# Patient Record
Sex: Female | Born: 1980
Health system: Southern US, Community
[De-identification: ages and names within clinical notes are randomized; demographics above are authoritative.]

## PROBLEM LIST (undated history)

## (undated) DIAGNOSIS — G43909 Migraine, unspecified, not intractable, without status migrainosus: Secondary | ICD-10-CM

## (undated) HISTORY — DX: Migraine, unspecified, not intractable, without status migrainosus: G43.909

---
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
## 1999-12-23 ENCOUNTER — Emergency Department (HOSPITAL_COMMUNITY): Admission: EM | Admit: 1999-12-23 | Discharge: 1999-12-23 | Payer: Self-pay | Admitting: *Deleted

## 1999-12-23 ENCOUNTER — Encounter: Payer: Self-pay | Admitting: *Deleted

## 2001-07-19 ENCOUNTER — Emergency Department (HOSPITAL_COMMUNITY): Admission: EM | Admit: 2001-07-19 | Discharge: 2001-07-19 | Payer: Self-pay | Admitting: Emergency Medicine

## 2003-09-02 ENCOUNTER — Emergency Department (HOSPITAL_COMMUNITY): Admission: EM | Admit: 2003-09-02 | Discharge: 2003-09-03 | Payer: Self-pay | Admitting: Emergency Medicine

## 2003-09-05 ENCOUNTER — Emergency Department (HOSPITAL_COMMUNITY): Admission: EM | Admit: 2003-09-05 | Discharge: 2003-09-05 | Payer: Self-pay

## 2005-01-17 ENCOUNTER — Emergency Department (HOSPITAL_COMMUNITY): Admission: EM | Admit: 2005-01-17 | Discharge: 2005-01-18 | Payer: Self-pay | Admitting: Emergency Medicine

## 2005-02-12 ENCOUNTER — Emergency Department (HOSPITAL_COMMUNITY): Admission: EM | Admit: 2005-02-12 | Discharge: 2005-02-12 | Payer: Self-pay | Admitting: Emergency Medicine

## 2005-05-10 ENCOUNTER — Emergency Department (HOSPITAL_COMMUNITY): Admission: EM | Admit: 2005-05-10 | Discharge: 2005-05-10 | Payer: Self-pay | Admitting: Emergency Medicine

## 2006-01-27 ENCOUNTER — Emergency Department (HOSPITAL_COMMUNITY): Admission: EM | Admit: 2006-01-27 | Discharge: 2006-01-27 | Payer: Self-pay | Admitting: Emergency Medicine

## 2008-06-01 ENCOUNTER — Emergency Department (HOSPITAL_COMMUNITY): Admission: EM | Admit: 2008-06-01 | Discharge: 2008-06-02 | Payer: Self-pay | Admitting: Emergency Medicine

## 2008-06-04 ENCOUNTER — Inpatient Hospital Stay (HOSPITAL_COMMUNITY): Admission: AD | Admit: 2008-06-04 | Discharge: 2008-06-04 | Payer: Self-pay | Admitting: Obstetrics & Gynecology

## 2008-06-26 ENCOUNTER — Encounter: Payer: Self-pay | Admitting: Obstetrics and Gynecology

## 2008-06-26 ENCOUNTER — Ambulatory Visit: Payer: Self-pay | Admitting: Obstetrics and Gynecology

## 2009-02-22 ENCOUNTER — Inpatient Hospital Stay (HOSPITAL_COMMUNITY): Admission: AD | Admit: 2009-02-22 | Discharge: 2009-02-22 | Payer: Self-pay | Admitting: Obstetrics & Gynecology

## 2009-04-14 ENCOUNTER — Ambulatory Visit (HOSPITAL_COMMUNITY): Admission: RE | Admit: 2009-04-14 | Discharge: 2009-04-14 | Payer: Self-pay | Admitting: Obstetrics & Gynecology

## 2009-09-01 ENCOUNTER — Inpatient Hospital Stay (HOSPITAL_COMMUNITY): Admission: AD | Admit: 2009-09-01 | Discharge: 2009-09-01 | Payer: Self-pay | Admitting: Obstetrics

## 2009-09-01 ENCOUNTER — Ambulatory Visit: Payer: Self-pay | Admitting: Obstetrics and Gynecology

## 2009-09-06 ENCOUNTER — Inpatient Hospital Stay (HOSPITAL_COMMUNITY): Admission: AD | Admit: 2009-09-06 | Discharge: 2009-09-06 | Payer: Self-pay | Admitting: Obstetrics

## 2009-09-06 ENCOUNTER — Inpatient Hospital Stay (HOSPITAL_COMMUNITY): Admission: AD | Admit: 2009-09-06 | Discharge: 2009-09-10 | Payer: Self-pay | Admitting: Obstetrics

## 2009-09-07 ENCOUNTER — Encounter: Payer: Self-pay | Admitting: Obstetrics

## 2010-03-14 ENCOUNTER — Encounter: Payer: Self-pay | Admitting: Obstetrics & Gynecology

## 2010-05-08 LAB — CBC
HCT: 25.9 % — ABNORMAL LOW (ref 36.0–46.0)
HCT: 37.4 % (ref 36.0–46.0)
Hemoglobin: 12.6 g/dL (ref 12.0–15.0)
Hemoglobin: 8.8 g/dL — ABNORMAL LOW (ref 12.0–15.0)
MCHC: 33.7 g/dL (ref 30.0–36.0)
MCV: 88.6 fL (ref 78.0–100.0)
RBC: 4.22 MIL/uL (ref 3.87–5.11)
RDW: 14.3 % (ref 11.5–15.5)
WBC: 11.4 10*3/uL — ABNORMAL HIGH (ref 4.0–10.5)
WBC: 7.9 10*3/uL (ref 4.0–10.5)

## 2010-05-08 LAB — CCBB MATERNAL DONOR DRAW

## 2010-05-09 LAB — URINALYSIS, ROUTINE W REFLEX MICROSCOPIC
Glucose, UA: NEGATIVE mg/dL
Specific Gravity, Urine: 1.025 (ref 1.005–1.030)
pH: 6 (ref 5.0–8.0)

## 2010-05-09 LAB — URINE MICROSCOPIC-ADD ON

## 2010-06-02 LAB — URINE CULTURE

## 2010-06-02 LAB — POCT I-STAT, CHEM 8
HCT: 38 % (ref 36.0–46.0)
Hemoglobin: 12.9 g/dL (ref 12.0–15.0)
Potassium: 3.7 mEq/L (ref 3.5–5.1)
Sodium: 138 mEq/L (ref 135–145)
TCO2: 21 mmol/L (ref 0–100)

## 2010-06-02 LAB — DIFFERENTIAL
Eosinophils Relative: 4 % (ref 0–5)
Lymphocytes Relative: 40 % (ref 12–46)
Lymphs Abs: 2.9 10*3/uL (ref 0.7–4.0)
Monocytes Relative: 8 % (ref 3–12)
Neutrophils Relative %: 47 % (ref 43–77)

## 2010-06-02 LAB — CBC
MCV: 92.2 fL (ref 78.0–100.0)
Platelets: 238 10*3/uL (ref 150–400)
RBC: 3.9 MIL/uL (ref 3.87–5.11)
RBC: 3.98 MIL/uL (ref 3.87–5.11)
WBC: 7.2 10*3/uL (ref 4.0–10.5)
WBC: 5.3 10*3/uL (ref 4.0–10.5)

## 2010-06-02 LAB — URINALYSIS, ROUTINE W REFLEX MICROSCOPIC
Glucose, UA: NEGATIVE mg/dL
Leukocytes, UA: NEGATIVE
Nitrite: NEGATIVE
Protein, ur: 30 mg/dL — AB

## 2010-06-02 LAB — ABO/RH: ABO/RH(D): O POS

## 2010-06-02 LAB — URINE MICROSCOPIC-ADD ON

## 2010-06-02 LAB — HCG, QUANTITATIVE, PREGNANCY
hCG, Beta Chain, Quant, S: 1486 m[IU]/mL — ABNORMAL HIGH (ref <5)
hCG, Beta Chain, Quant, S: 2173 m[IU]/mL — ABNORMAL HIGH (ref <5)

## 2010-06-02 LAB — WET PREP, GENITAL: Trich, Wet Prep: NONE SEEN

## 2010-07-06 NOTE — Group Therapy Note (Signed)
NAMEJOSEFITA, [name]                ACCOUNT NO.:  [id number]   MEDICAL RECORD NO.:  [id number]          PATIENT TYPE:  WOC   LOCATION:  [facility]                   FACILITY:  [facility]   PHYSICIAN:  Argentina Donovan, MD        DATE OF BIRTH:  Sep 13, 1980   DATE OF SERVICE:                                  CLINIC NOTE   HISTORY OF PRESENT ILLNESS:  The patient is a new patient here for  followup on a spontaneous abortion on June 01, 2008.  The patient was  referred by the emergency department for followup.  She did receive  Cytotec from the emergency department.  The patient originally presented  to the ED with complaints of vaginal bleeding and passage of clots that  evening.  She also had some associated abdominal cramping.  Her last  menstrual period was noted to be on April 27, 2008.  The patient did  receive an ultrasound which showed a small amount of endometrial fluid  likely related to tiny intrauterine gestational sac.  The sac diameter  of 3 mm corresponded to 5 weeks, 0 days.  There was no yolk sac, fetal  pole, or cardiac activity identified.  Right ovarian 1.2 cm complex  cystic lesion was also seen which was thought to be most likely corpus  luteal cyst.  Beta hCG at that time was 1486 milli-international  units/mL.  Hemoglobin at that time was 11.8.  Her BMP was unremarkable.  Urine pregnancy was positive in the emergency department.  She was, at  that time, discharged home with the discussion that this was likely  possible inevitable versus threatened abortion.  She was advised to  follow up in 48 hours for repeat clot which she did on June 04, 2008.  At that time, she had continued to have bleeding with 1-cm clots and  more cramping.  Quantitative hCG at that time was 2173 which was not  doubling.  Repeat ultrasound showed intrauterine gestational sac but no  yolk sac.  Options of management were discussed with the patient at  length and she opted to use Cytotec, and this was  placed while she was  there in the emergency department.  Since that visit, she has not had  any more abdominal pain or cramping, has had some moderate bleeding  which has resolved by this office visit.  She denies any fevers,  cramping, or abdominal pain, and states that she is otherwise feeling  well.  She does report that this was not a planned pregnancy, however,  would like to continue to try to become pregnant this year.   PAST MEDICAL HISTORY:  Significant for no medical problems.   ALLERGIES:  No known drug allergies.   MEDICATIONS:  Prenatal vitamins.   MENSTRUAL HISTORY:  Age of menses is age 30, typically lasts 7 days,  heavy and medium flow with moderate pain.  No bleeding between periods.   OBSTETRICAL HISTORY:  Includes 2 total pregnancies, one termination and  now one miscarriage.   CONTRACEPTION HISTORY:  None.   Date of last Pap was May 2009.  She had  1 abnormal Pap 5 years ago,  repeat was normal.   SURGICAL HISTORY:  None.   FAMILY HISTORY:  Significant for cancer in her mother who is now  deceased.   SOCIAL HISTORY:  She denies any tobacco, alcohol, or drug use.   REVIEW OF SYSTEMS:  Negative except as noted in the HPI.   PHYSICAL EXAMINATION:  VITAL SIGNS:  Temperature is 97.9, pulse 88,  blood pressure is 123/87, weight is 151.9 pounds.  She is 5 feet 7  inches, respiratory rate 16.  GENERAL:  A young African American female in no acute distress.  HEENT:  Mucous membranes are moist.  NECK:  Supple.  No thyromegaly.  LUNGS:  Clear to auscultation bilaterally.  CARDIOVASCULAR:  Regular rate and rhythm.  No murmurs.  ABDOMEN:  Soft, nontender, nondistended.  Bowel sounds are present.  GU:  Speculum exam revealed no acute white discharge in the posterior  fornix.  Cervix appeared normal with no lesions or ulcerations.  Os was  closed.  Bimanual exam was nontender with no adnexal masses.  EXTREMITIES:  Peripheral pulses 2+.  No clubbing or cyanosis.   PSYCH:  Mood was appropriate.  Affect appropriate.   ASSESSMENT:  Spontaneous abortion status post Cytotec.   PLAN:  The patient was counseled to continue prenatal vitamins if she  desired to conceive again.  She is also advised to wait at least 2-3  cycles before conceiving.  She was also given signs and symptoms for  when to call back such as fevers, abdominal cramping, or more vaginal  bleeding.  We will check a serum beta hCG today.  Pap smear was also  performed today as her last Pap was done in May 2009.  We will follow up  on the results and call the patient when the results are back.     ______________________________  Argentina Donovan, MD    ______________________________  Argentina Donovan, MD    PR/MEDQ  D:  06/26/2008  T:  06/27/2008  Job:  [id number]

## 2013-07-09 ENCOUNTER — Ambulatory Visit: Payer: Self-pay | Admitting: Advanced Practice Midwife

## 2013-07-23 ENCOUNTER — Encounter: Payer: Self-pay | Admitting: Advanced Practice Midwife

## 2013-07-23 ENCOUNTER — Ambulatory Visit (INDEPENDENT_AMBULATORY_CARE_PROVIDER_SITE_OTHER): Payer: BC Managed Care – PPO | Admitting: Advanced Practice Midwife

## 2013-07-23 VITALS — BP 128/75 | HR 73 | Temp 98.0°F | Ht 67.0 in | Wt 169.0 lb

## 2013-07-23 DIAGNOSIS — Z01419 Encounter for gynecological examination (general) (routine) without abnormal findings: Secondary | ICD-10-CM

## 2013-07-23 DIAGNOSIS — Z309 Encounter for contraceptive management, unspecified: Secondary | ICD-10-CM

## 2013-07-23 MED ORDER — NORGESTIM-ETH ESTRAD TRIPHASIC 0.18/0.215/0.25 MG-35 MCG PO TABS: 1.0000 | ORAL_TABLET | Freq: Every day | ORAL | Status: DC

## 2013-07-23 NOTE — Progress Notes (Signed)
.   Subjective:     [name] is a 33 y.o. female and is here for a comprehensive physical exam. The patient reports no problems.  History   Social History  . Marital Status: Single    Spouse Name: N/A    Number of Children: N/A  . Years of Education: N/A   Occupational History  . Not on file.   Social History Main Topics  . Smoking status: Former Games developer  . Smokeless tobacco: Never Used  . Alcohol Use: Yes  . Drug Use: No  . Sexual Activity: Yes    Partners: Male    Birth Control/ Protection: Pill   Other Topics Concern  . Not on file   Social History Narrative  . No narrative on file   Health Maintenance  Topic Date Due  . Pap Smear  02/25/1996  . Tetanus/tdap  02/24/1997  . Influenza Vaccine  09/21/2013    The following portions of the patient's history were reviewed and updated as appropriate: allergies, current medications, past family history, past medical history, past social history, past surgical history and problem list.  Review of Systems Constitutional: negative for fatigue and weight loss Respiratory: negative for cough and wheezing Cardiovascular: negative for chest pain, fatigue and palpitations Gastrointestinal: negative for abdominal pain and change in bowel habits Genitourinary:negative Integument/breast: negative for nipple discharge Musculoskeletal:negative for myalgias Neurological: negative for gait problems and tremors Behavioral/Psych: negative for abusive relationship, depression Endocrine: negative for temperature intolerance         Objective:    BP 128/75  Pulse 73  Temp(Src) 98 F (36.7 C)  Ht 5\' 7"  (1.702 m)  Wt 169 lb (76.658 kg)  BMI 26.46 kg/m2  LMP 06/30/2013 General appearance: alert and cooperative Head: Normocephalic, without obvious abnormality, atraumatic Eyes: conjunctivae/corneas clear. PERRL, EOM's intact. Fundi benign. Neck: no adenopathy, no carotid bruit, no JVD, supple, symmetrical, trachea midline  and thyroid not enlarged, symmetric, no tenderness/mass/nodules Back: symmetric, no curvature. ROM normal. No CVA tenderness. Lungs: clear to auscultation bilaterally Breasts: normal appearance, no masses or tenderness Heart: regular rate and rhythm, S1, S2 normal, no murmur, click, rub or gallop Abdomen: soft, non-tender; bowel sounds normal; no masses,  no organomegaly Pelvic: cervix normal in appearance, external genitalia normal, no adnexal masses or tenderness, no cervical motion tenderness, rectovaginal septum normal, uterus normal size, shape, and consistency and vagina normal without discharge Extremities: extremities normal, atraumatic, no cyanosis or edema Pulses: 2+ and symmetric Skin: Skin color, texture, turgor normal. No rashes or lesions Lymph nodes: Cervical, supraclavicular, and axillary nodes normal. Neurologic: Grossly normal    Assessment:    Healthy female exam.  BCM: Ortho Tri Cyclen      Plan:      Orders Placed This Encounter  Procedures  . GC/Chlamydia Probe Amp  . HIV antibody  . Hepatitis B surface antigen  . RPR  . Hepatitis C antibody  . TSH  . CBC  . Comprehensive metabolic panel   Meds ordered this encounter  Medications  . Norgestimate-Ethinyl Estradiol Triphasic 0.18/0.215/0.25 MG-35 MCG tablet    Sig: Take 1 tablet by mouth daily.    Dispense:  1 Package    Refill:  11    Order Specific Question:  Supervising Provider    Answer:  Coral Ceo A [[id number]]    See After Visit Summary for Counseling Recommendations

## 2013-07-24 LAB — COMPREHENSIVE METABOLIC PANEL
ALT: 10 U/L (ref 0–35)
AST: 14 U/L (ref 0–37)
Albumin: 4 g/dL (ref 3.5–5.2)
Alkaline Phosphatase: 38 U/L — ABNORMAL LOW (ref 39–117)
BUN: 11 mg/dL (ref 6–23)
CALCIUM: 9.2 mg/dL (ref 8.4–10.5)
CHLORIDE: 101 meq/L (ref 96–112)
CO2: 28 meq/L (ref 19–32)
CREATININE: 0.69 mg/dL (ref 0.50–1.10)
Glucose, Bld: 57 mg/dL — ABNORMAL LOW (ref 70–99)
Potassium: 3.7 mEq/L (ref 3.5–5.3)
Sodium: 135 mEq/L (ref 135–145)
Total Bilirubin: 0.4 mg/dL (ref 0.2–1.2)
Total Protein: 6.8 g/dL (ref 6.0–8.3)

## 2013-07-24 LAB — CBC
HEMATOCRIT: 35.9 % — AB (ref 36.0–46.0)
Hemoglobin: 11.6 g/dL — ABNORMAL LOW (ref 12.0–15.0)
MCH: 29.3 pg (ref 26.0–34.0)
MCHC: 32.3 g/dL (ref 30.0–36.0)
MCV: 90.7 fL (ref 78.0–100.0)
Platelets: 264 10*3/uL (ref 150–400)
RBC: 3.96 MIL/uL (ref 3.87–5.11)
RDW: 12.5 % (ref 11.5–15.5)
WBC: 5.7 10*3/uL (ref 4.0–10.5)

## 2013-07-24 LAB — HEPATITIS B SURFACE ANTIGEN: HEP B S AG: NEGATIVE

## 2013-07-24 LAB — HIV ANTIBODY (ROUTINE TESTING W REFLEX): HIV 1&2 Ab, 4th Generation: NONREACTIVE

## 2013-07-24 LAB — HEPATITIS C ANTIBODY: HCV Ab: NEGATIVE

## 2013-07-24 LAB — GC/CHLAMYDIA PROBE AMP
CT Probe RNA: NEGATIVE
GC Probe RNA: NEGATIVE

## 2013-07-24 LAB — TSH: TSH: 0.426 u[IU]/mL (ref 0.350–4.500)

## 2013-07-24 LAB — RPR

## 2013-07-25 LAB — PAP IG AND HPV HIGH-RISK: HPV DNA HIGH RISK: NOT DETECTED

## 2013-08-28 ENCOUNTER — Other Ambulatory Visit: Payer: Self-pay | Admitting: Obstetrics & Gynecology

## 2013-11-18 ENCOUNTER — Telehealth: Payer: Self-pay | Admitting: *Deleted

## 2013-11-18 NOTE — Telephone Encounter (Signed)
Fax from pharmacy requesting refill on Tri Previfem tablet   Please advise

## 2013-11-21 NOTE — Telephone Encounter (Signed)
[facility] faxed over another refill request for Tri-Previfem Tablets, QTY: 28, Refills: 2   Please Advise.

## 2013-11-24 NOTE — Telephone Encounter (Signed)
OK to refill

## 2013-11-25 ENCOUNTER — Other Ambulatory Visit: Payer: Self-pay | Admitting: *Deleted

## 2013-11-25 DIAGNOSIS — Z3041 Encounter for surveillance of contraceptive pills: Secondary | ICD-10-CM

## 2013-11-25 MED ORDER — NORGESTIM-ETH ESTRAD TRIPHASIC 0.18/0.215/0.25 MG-35 MCG PO TABS: 1.0000 | ORAL_TABLET | Freq: Every day | ORAL | Status: DC

## 2013-11-25 NOTE — Telephone Encounter (Signed)
Patient called regarding refill. Refill okayed by Dr. Tamela OddiJackson-Moore. Patient is up to date so Rx sent with 11 refills. I apologized to the patient for the incontinence and notified her to double up on her pills today since she missed yesterday. Patient notified that she should not have this issue any longer because she was given 11 refills. Patient voiced understanding.

## 2013-11-26 ENCOUNTER — Telehealth: Payer: Self-pay | Admitting: *Deleted

## 2013-11-26 NOTE — Telephone Encounter (Signed)
Refill was approved by Dr. Tamela OddiJackson-Moore and was taken care of yesterday.

## 2013-11-26 NOTE — Telephone Encounter (Signed)
Fax from pharmacy for refill on Tri Previfem tablet, 28 day.   Please advise on refill.

## 2013-12-23 ENCOUNTER — Encounter: Payer: Self-pay | Admitting: Advanced Practice Midwife

## 2014-02-17 ENCOUNTER — Encounter: Payer: Self-pay | Admitting: *Deleted

## 2014-02-18 ENCOUNTER — Encounter: Payer: Self-pay | Admitting: Obstetrics & Gynecology

## 2014-05-06 ENCOUNTER — Telehealth: Payer: Self-pay | Admitting: *Deleted

## 2014-05-06 NOTE — Telephone Encounter (Signed)
Patient is requesting a RF on her Ibuprofen 800 mg at [facility].

## 2014-05-07 ENCOUNTER — Other Ambulatory Visit: Payer: Self-pay | Admitting: Obstetrics

## 2014-05-07 DIAGNOSIS — N946 Dysmenorrhea, unspecified: Secondary | ICD-10-CM

## 2014-05-07 MED ORDER — IBUPROFEN 800 MG PO TABS: 800.0000 mg | ORAL_TABLET | Freq: Three times a day (TID) | ORAL | Status: DC | PRN

## 2014-05-07 NOTE — Telephone Encounter (Signed)
Ibuprofen Rx. 

## 2014-05-09 NOTE — Telephone Encounter (Signed)
Patient notified per VM 

## 2014-07-25 ENCOUNTER — Ambulatory Visit: Payer: Self-pay | Admitting: Certified Nurse Midwife

## 2014-08-12 ENCOUNTER — Ambulatory Visit (INDEPENDENT_AMBULATORY_CARE_PROVIDER_SITE_OTHER): Payer: BLUE CROSS/BLUE SHIELD | Admitting: Certified Nurse Midwife

## 2014-08-12 ENCOUNTER — Encounter: Payer: Self-pay | Admitting: Certified Nurse Midwife

## 2014-08-12 VITALS — BP 127/78 | HR 72 | Temp 98.6°F | Ht 67.0 in | Wt 166.8 lb

## 2014-08-12 DIAGNOSIS — Z9109 Other allergy status, other than to drugs and biological substances: Secondary | ICD-10-CM | POA: Insufficient documentation

## 2014-08-12 DIAGNOSIS — Z91048 Other nonmedicinal substance allergy status: Secondary | ICD-10-CM

## 2014-08-12 DIAGNOSIS — R51 Headache: Secondary | ICD-10-CM | POA: Diagnosis not present

## 2014-08-12 DIAGNOSIS — R519 Headache, unspecified: Secondary | ICD-10-CM | POA: Insufficient documentation

## 2014-08-12 DIAGNOSIS — Z3009 Encounter for other general counseling and advice on contraception: Secondary | ICD-10-CM | POA: Diagnosis not present

## 2014-08-12 DIAGNOSIS — N939 Abnormal uterine and vaginal bleeding, unspecified: Secondary | ICD-10-CM | POA: Diagnosis not present

## 2014-08-12 DIAGNOSIS — Z124 Encounter for screening for malignant neoplasm of cervix: Secondary | ICD-10-CM

## 2014-08-12 DIAGNOSIS — Z01419 Encounter for gynecological examination (general) (routine) without abnormal findings: Secondary | ICD-10-CM

## 2014-08-12 DIAGNOSIS — G8929 Other chronic pain: Secondary | ICD-10-CM

## 2014-08-12 DIAGNOSIS — Z113 Encounter for screening for infections with a predominantly sexual mode of transmission: Secondary | ICD-10-CM

## 2014-08-12 DIAGNOSIS — G43919 Migraine, unspecified, intractable, without status migrainosus: Secondary | ICD-10-CM

## 2014-08-12 LAB — CBC WITH DIFFERENTIAL/PLATELET
BASOS PCT: 0 % (ref 0–1)
Basophils Absolute: 0 10*3/uL (ref 0.0–0.1)
EOS PCT: 3 % (ref 0–5)
Eosinophils Absolute: 0.2 10*3/uL (ref 0.0–0.7)
HCT: 36.9 % (ref 36.0–46.0)
Hemoglobin: 11.6 g/dL — ABNORMAL LOW (ref 12.0–15.0)
Lymphocytes Relative: 49 % — ABNORMAL HIGH (ref 12–46)
Lymphs Abs: 2.7 10*3/uL (ref 0.7–4.0)
MCH: 28.8 pg (ref 26.0–34.0)
MCHC: 31.4 g/dL (ref 30.0–36.0)
MCV: 91.6 fL (ref 78.0–100.0)
MONO ABS: 0.4 10*3/uL (ref 0.1–1.0)
MONOS PCT: 7 % (ref 3–12)
MPV: 10.9 fL (ref 8.6–12.4)
NEUTROS ABS: 2.3 10*3/uL (ref 1.7–7.7)
Neutrophils Relative %: 41 % — ABNORMAL LOW (ref 43–77)
Platelets: 228 10*3/uL (ref 150–400)
RBC: 4.03 MIL/uL (ref 3.87–5.11)
RDW: 12.1 % (ref 11.5–15.5)
WBC: 5.5 10*3/uL (ref 4.0–10.5)

## 2014-08-12 LAB — COMPREHENSIVE METABOLIC PANEL
ALT: 10 U/L (ref 0–35)
AST: 15 U/L (ref 0–37)
Albumin: 3.9 g/dL (ref 3.5–5.2)
Alkaline Phosphatase: 44 U/L (ref 39–117)
BILIRUBIN TOTAL: 0.4 mg/dL (ref 0.2–1.2)
BUN: 9 mg/dL (ref 6–23)
CALCIUM: 8.7 mg/dL (ref 8.4–10.5)
CHLORIDE: 105 meq/L (ref 96–112)
CO2: 25 meq/L (ref 19–32)
Creat: 0.71 mg/dL (ref 0.50–1.10)
Glucose, Bld: 69 mg/dL — ABNORMAL LOW (ref 70–99)
Potassium: 4.1 mEq/L (ref 3.5–5.3)
SODIUM: 140 meq/L (ref 135–145)
TOTAL PROTEIN: 6.5 g/dL (ref 6.0–8.3)

## 2014-08-12 LAB — TRIGLYCERIDES: Triglycerides: 94 mg/dL (ref <150)

## 2014-08-12 LAB — HDL CHOLESTEROL: HDL: 40 mg/dL — ABNORMAL LOW (ref >=46)

## 2014-08-12 LAB — CHOLESTEROL, TOTAL: Cholesterol: 124 mg/dL (ref 0–200)

## 2014-08-12 LAB — TSH: TSH: 0.533 u[IU]/mL (ref 0.350–4.500)

## 2014-08-12 MED ORDER — LEVONORGEST-ETH ESTRAD 91-DAY 0.15-0.03 &0.01 MG PO TABS: 1.0000 | ORAL_TABLET | Freq: Every day | ORAL | Status: DC

## 2014-08-12 NOTE — Progress Notes (Signed)
Patient ID: Addison Lank, female   DOB: December 01, 1980, 34 y.o.   MRN: [id number]    Subjective:     Rhonna L Bruesch is a 34 y.o. female here for a routine exam.  Current complaints:  HA for a few months, takes Excedrin tension/migraine.  Has a hx of migraines.  Daughter has allergies.  Recent sinusitis infection.  Having migraines/tension HA 1-2X/month.  Monthly menses, lasting 5-6 days, with heavy bleeding, occasional clots about dime-niclel sized.  Employed full-time in day care.  Discussed various OCP options, decided to try 3 month OCPs due to the AUB with her current OCPs.    Personal health questionnaire:  Is patient [name], have a family history of breast and/or ovarian cancer: no Is there a family history of uterine cancer diagnosed at age < 101, gastrointestinal cancer, urinary tract cancer, family member who is a Personnel officer syndrome-associated carrier: no Is the patient overweight and hypertensive, family history of diabetes, personal history of gestational diabetes, preeclampsia or PCOS: no Is patient over 70, have PCOS,  family history of premature CHD under age 55, diabetes, smoke, have hypertension or peripheral artery disease:  no At any time, has a partner hit, kicked or otherwise hurt or frightened you?: no Over the past 2 weeks, have you felt down, depressed or hopeless?: no Over the past 2 weeks, have you felt little interest or pleasure in doing things?:no   Gynecologic History Patient's last menstrual period was 07/24/2014. Contraception: OCP (estrogen/progesterone) Last Pap: 07/23/13. Results were: normal Last mammogram: N/A.   Obstetric History OB History  Gravida Para Term Preterm AB SAB TAB Ectopic Multiple Living  3 1 1  2 1 1   1     # Outcome Date GA Lbr Len/2nd Weight Sex Delivery Anes PTL Lv  3 Term 09/07/09 [redacted]w[redacted]d  7 lb 9 oz (3.43 kg) F CS-LTranv EPI  Y  2 SAB 2010 [redacted]w[redacted]d       N  1 TAB 2010 [redacted]w[redacted]d       N      History reviewed. No pertinent past medical  history.  Past Surgical History  Procedure Laterality Date  . Cesarean section       Current outpatient prescriptions:  .  aspirin-acetaminophen-caffeine (EXCEDRIN MIGRAINE) 250-250-65 MG per tablet, Take by mouth every 6 (six) hours as needed for headache., Disp: , Rfl:  .  ibuprofen (ADVIL,MOTRIN) 800 MG tablet, Take 1 tablet (800 mg total) by mouth every 8 (eight) hours as needed., Disp: 30 tablet, Rfl: 5 .  Norgestimate-Ethinyl Estradiol Triphasic (TRI-PREVIFEM) 0.18/0.215/0.25 MG-35 MCG tablet, Take 1 tablet by mouth daily., Disp: 28 tablet, Rfl: 11 .  Levonorgestrel-Ethinyl Estradiol (AMETHIA,CAMRESE) 0.15-0.03 &0.01 MG tablet, Take 1 tablet by mouth daily., Disp: 1 Package, Rfl: 4 Allergies  Allergen Reactions  . Penicillins Itching    Penicillin IV during labor.    History  Substance Use Topics  . Smoking status: Former Games developer  . Smokeless tobacco: Never Used  . Alcohol Use: 0.0 oz/week    0 Standard drinks or equivalent per week     Comment: wine    Family History  Problem Relation Age of Onset  . Cancer Mother    Mother had oral cancer from smoking tobacco products.     Review of Systems  Constitutional: negative for fatigue and weight loss Respiratory: negative for cough and wheezing Cardiovascular: negative for chest pain, fatigue and palpitations Gastrointestinal: negative for abdominal pain and change in bowel habits Musculoskeletal:negative for myalgias Neurological:  negative for gait problems and tremors Behavioral/Psych: negative for abusive relationship, depression Endocrine: negative for temperature intolerance   Genitourinary:negative for abnormal menstrual periods, genital lesions, hot flashes, sexual problems and vaginal discharge Integument/breast: negative for breast lump, breast tenderness, nipple discharge and skin lesion(s)    Objective:       BP 127/78 mmHg  Pulse 72  Temp(Src) 98.6 F (37 C)  Ht  (1.702 m)  Wt 166 lb 12.8 oz (75.66  kg)  BMI 26.12 kg/m2  LMP 07/24/2014 General:   alert  Skin:   no rash or abnormalities  Lungs:   clear to auscultation bilaterally  Heart:   regular rate and rhythm, S1, S2 normal, no murmur, click, rub or gallop  Breasts:   normal without suspicious masses, skin or nipple changes or axillary nodes  Abdomen:  normal findings: no organomegaly, soft, non-tender and no hernia  Pelvis:  External genitalia: normal general appearance Urinary system: urethral meatus normal and bladder without fullness, nontender Vaginal: normal without tenderness, induration or masses Cervix: normal appearance, no CMT Adnexa: normal bimanual exam Uterus: anteverted and non-tender, normal size   Lab Review Urine pregnancy test Labs reviewed yes Radiologic studies reviewed no  50% of 30 min visit spent on counseling and coordination of care.   Assessment:    Healthy female exam.   Environmental allergies Chronic HA AUB Contraception Counseling   Plan:    Education reviewed: depression evaluation, low fat, low cholesterol diet, safe sex/STD prevention, self breast exams, skin cancer screening and weight bearing exercise. Contraception: OCP (estrogen/progesterone). Follow up in: 1 year.   Meds ordered this encounter  Medications  . aspirin-acetaminophen-caffeine (EXCEDRIN MIGRAINE) 250-250-65 MG per tablet    Sig: Take by mouth every 6 (six) hours as needed for headache.  . Levonorgestrel-Ethinyl Estradiol (AMETHIA,CAMRESE) 0.15-0.03 &0.01 MG tablet    Sig: Take 1 tablet by mouth daily.    Dispense:  1 Package    Refill:  4   Orders Placed This Encounter  Procedures  . SureSwab, Vaginosis/Vaginitis Plus  . US Transvaginal Non-OB    Standing Status: Future     Number of Occurrences:      Standing Expiration Date: 10/12/2015    Order Specific Question:  Reason for Exam (SYMPTOM  OR DIAGNOSIS REQUIRED)    Answer:  AUB    Order Specific Question:  Preferred imaging location?    Answer:   Internal  . CBC with Differential/Platelet  . Comprehensive metabolic panel  . TSH  . Hepatitis B surface antigen  . RPR  . Hepatitis C antibody  . Cholesterol, total  . Triglycerides  . HDL cholesterol  . HIV antibody (with reflex)  . Ambulatory referral to ENT    Referral Priority:  Routine    Referral Type:  Consultation    Referral Reason:  Specialty Services Required    Requested Specialty:  Otolaryngology    Number of Visits Requested:  1  . Ambulatory referral to Neurology    Referral Priority:  Routine    Referral Type:  Consultation    Referral Reason:  Specialty Services Required    Requested Specialty:  Neurology    Number of Visits Requested:  1    Possible management options include: another method of contraception

## 2014-08-13 LAB — HEPATITIS C ANTIBODY: HCV Ab: NEGATIVE

## 2014-08-13 LAB — HIV ANTIBODY (ROUTINE TESTING W REFLEX): HIV: NONREACTIVE

## 2014-08-13 LAB — HEPATITIS B SURFACE ANTIGEN: Hepatitis B Surface Ag: NEGATIVE

## 2014-08-13 LAB — RPR

## 2014-08-14 LAB — PAP IG AND HPV HIGH-RISK: HPV DNA HIGH RISK: NOT DETECTED

## 2014-08-15 LAB — SURESWAB, VAGINOSIS/VAGINITIS PLUS
ATOPOBIUM VAGINAE: NOT DETECTED Log (cells/mL)
C. GLABRATA, DNA: NOT DETECTED
C. TRACHOMATIS RNA, TMA: NOT DETECTED
C. TROPICALIS, DNA: NOT DETECTED
C. albicans, DNA: NOT DETECTED
C. parapsilosis, DNA: NOT DETECTED
Gardnerella vaginalis: NOT DETECTED Log (cells/mL)
LACTOBACILLUS SPECIES: 7.5 Log (cells/mL)
MEGASPHAERA SPECIES: NOT DETECTED Log (cells/mL)
N. GONORRHOEAE RNA, TMA: NOT DETECTED
T. vaginalis RNA, QL TMA: NOT DETECTED

## 2014-08-21 ENCOUNTER — Telehealth: Payer: Self-pay

## 2014-08-21 NOTE — Telephone Encounter (Signed)
[facility] tried to call patient for appt - no answer, I called patient and she had not checked her vm - told her to call them at [phone]

## 2014-09-04 ENCOUNTER — Ambulatory Visit: Payer: BLUE CROSS/BLUE SHIELD | Admitting: Certified Nurse Midwife

## 2014-09-04 ENCOUNTER — Other Ambulatory Visit: Payer: BLUE CROSS/BLUE SHIELD

## 2014-09-11 ENCOUNTER — Other Ambulatory Visit: Payer: Self-pay | Admitting: Certified Nurse Midwife

## 2014-09-11 ENCOUNTER — Encounter: Payer: Self-pay | Admitting: Certified Nurse Midwife

## 2014-09-11 ENCOUNTER — Ambulatory Visit (INDEPENDENT_AMBULATORY_CARE_PROVIDER_SITE_OTHER): Payer: BLUE CROSS/BLUE SHIELD

## 2014-09-11 ENCOUNTER — Ambulatory Visit (INDEPENDENT_AMBULATORY_CARE_PROVIDER_SITE_OTHER): Payer: BLUE CROSS/BLUE SHIELD | Admitting: Certified Nurse Midwife

## 2014-09-11 VITALS — BP 133/85 | HR 70 | Temp 98.2°F | Ht 67.0 in | Wt 168.0 lb

## 2014-09-11 DIAGNOSIS — N8320 Unspecified ovarian cysts: Secondary | ICD-10-CM

## 2014-09-11 DIAGNOSIS — N939 Abnormal uterine and vaginal bleeding, unspecified: Secondary | ICD-10-CM

## 2014-09-11 DIAGNOSIS — N83202 Unspecified ovarian cyst, left side: Secondary | ICD-10-CM | POA: Insufficient documentation

## 2014-09-11 NOTE — Progress Notes (Signed)
Patient ID: [name], female   DOB: 1980-04-23, 34 y.o.   MRN: [id number]   Chief Complaint  Patient presents with  . Follow-up    ultrasound    HPI [name] is a 34 y.o. female.  Here for f/u on transvaginal US for AUB.  States she has just started on the [place] OCPs.  States that for the past two weeks she has not had spotting, so far likes the new pill.     HPI  History reviewed. No pertinent past medical history.  Past Surgical History  Procedure Laterality Date  . Cesarean section      Family History  Problem Relation Age of Onset  . Cancer Mother     Social History History  Substance Use Topics  . Smoking status: Former Games developer  . Smokeless tobacco: Never Used  . Alcohol Use: 0.0 oz/week    0 Standard drinks or equivalent per week     Comment: wine    Allergies  Allergen Reactions  . Penicillins Itching    Penicillin IV during labor.    Current Outpatient Prescriptions  Medication Sig Dispense Refill  . aspirin-acetaminophen-caffeine (EXCEDRIN MIGRAINE) 250-250-65 MG per tablet Take by mouth every 6 (six) hours as needed for headache.    . ibuprofen (ADVIL,MOTRIN) 800 MG tablet Take 1 tablet (800 mg total) by mouth every 8 (eight) hours as needed. 30 tablet 5  . Levonorgestrel-Ethinyl Estradiol (AMETHIA,CAMRESE) 0.15-0.03 &0.01 MG tablet Take 1 tablet by mouth daily. 1 Package 4  . Norgestimate-Ethinyl Estradiol Triphasic (TRI-PREVIFEM) 0.18/0.215/0.25 MG-35 MCG tablet Take 1 tablet by mouth daily. (Patient not taking: Reported on 09/11/2014) 28 tablet 11   No current facility-administered medications for this visit.    Review of Systems Review of Systems Constitutional: negative for fatigue and weight loss Respiratory: negative for cough and wheezing Cardiovascular: negative for chest pain, fatigue and palpitations Gastrointestinal: negative for abdominal pain and change in bowel habits Genitourinary:negative Integument/breast: negative for  nipple discharge Musculoskeletal:negative for myalgias Neurological: negative for gait problems and tremors Behavioral/Psych: negative for abusive relationship, depression Endocrine: negative for temperature intolerance     Blood pressure 133/85, pulse 70, temperature 98.2 F (36.8 C), height  (1.702 m), weight 168 lb (76.204 kg), last menstrual period 08/21/2014.  Physical Exam Physical Exam General:   alert  Skin:   no rash or abnormalities  Lungs:   clear to auscultation bilaterally  Heart:   regular rate and rhythm, S1, S2 normal, no murmur, click, rub or gallop  Breasts:   normal without suspicious masses, skin or nipple changes or axillary nodes  Abdomen:  normal findings: no organomegaly, soft, non-tender and no hernia  Pelvis:  External genitalia: normal general appearance Urinary system: urethral meatus normal and bladder without fullness, nontender Vaginal: normal without tenderness, induration or masses Cervix: normal appearance Adnexa: normal bimanual exam Uterus: anteverted and non-tender, normal size    100% of 15 min visit spent on counseling and coordination of care.   Data Reviewed Previous medical hx, labs, meds  Assessment     Small left simple ovarian cyst     Plan    Orders Placed This Encounter  Procedures  . US Transvaginal Non-OB    Standing Status: Future     Number of Occurrences:      Standing Expiration Date: 11/12/2015    Order Specific Question:  Reason for Exam (SYMPTOM  OR DIAGNOSIS REQUIRED)    Answer:  left ovarian cyst f/u  Order Specific Question:  Preferred imaging location?    Answer:  Internal  . US Pelvis Complete    Standing Status: Future     Number of Occurrences:      Standing Expiration Date: 11/12/2015    Order Specific Question:  Reason for Exam (SYMPTOM  OR DIAGNOSIS REQUIRED)    Answer:  left ovarian cyst f/u    Order Specific Question:  Preferred imaging location?    Answer:  Internal   No orders of the  defined types were placed in this encounter.    Follow up in 3 months for repeat transvaginal US

## 2014-09-16 ENCOUNTER — Other Ambulatory Visit: Payer: Self-pay | Admitting: Certified Nurse Midwife

## 2014-10-14 ENCOUNTER — Ambulatory Visit: Payer: Self-pay | Admitting: Neurology

## 2014-11-10 ENCOUNTER — Encounter: Payer: Self-pay | Admitting: Neurology

## 2014-11-10 ENCOUNTER — Ambulatory Visit (INDEPENDENT_AMBULATORY_CARE_PROVIDER_SITE_OTHER): Payer: BLUE CROSS/BLUE SHIELD | Admitting: Neurology

## 2014-11-10 VITALS — BP 130/80 | HR 78 | Ht 67.0 in | Wt 163.0 lb

## 2014-11-10 DIAGNOSIS — G43009 Migraine without aura, not intractable, without status migrainosus: Secondary | ICD-10-CM

## 2014-11-10 NOTE — Patient Instructions (Signed)
Migraine Recommendations: 2.  Take Excedrin for the headaches (limit to no more than 2 days out of the week) 3.  Limit use of pain relievers to no more than 2 days out of the week.  These medications include acetaminophen, ibuprofen, triptans and narcotics.  This will help reduce risk of rebound headaches. 4.  Be aware of common food triggers such as processed sweets, processed foods with nitrites (such as deli meat, hot dogs, sausages), foods with MSG, alcohol (such as wine), chocolate, certain cheeses, certain fruits (dried fruits, some citrus fruit), vinegar, diet soda. 4.  Avoid caffeine 5.  Routine exercise 6.  Proper sleep hygiene 7.  Stay adequately hydrated with water 8.  Keep a headache diary. 9.  Maintain proper stress management. 10.  Do not skip meals. 11.  Consider supplements:  Magnesium oxide  to  daily, riboflavin , Coenzyme Q 10  three times daily if they are frequent 12.. Follow up as needed.

## 2014-11-10 NOTE — Progress Notes (Signed)
NEUROLOGY CONSULTATION NOTE  [name] MRN: [id number] DOB: 24-May-1980  Referring provider: Franklyn Lor, CNM Primary care provider: none  Reason for consult:  migraine  HISTORY OF PRESENT ILLNESS: [name] is a 34 year old right-handed female who presents for migraine.  History obtained by patient and OBGYN note.  Labs reviewed.  Onset:  Since third grade Location:  Bi-temporal Quality:  throbbing Intensity:  9/10 Aura:  no Prodrome:  no Associated symptoms:  Nausea, vomiting, photophobia. Duration:  Usually quickly with treatment, usually no longer than 1 hour if not take medication in time. Frequency:  2 days per month Triggers/exacerbating factors:  none Relieving factors:  none Activity:  Needs to lay down and sleep  Past abortive medication:  Aspirin, tylenol extra-strength, ibuprofen  Past preventative medication:  none Other past therapy:  none  Current abortive medication:  Excedrin Tension (effective) Antihypertensive medications:  no Antidepressant medications:  no Anticonvulsant medications:  no  Caffeine:  Coffee daily in AM.  Also drinks tea and soda Alcohol:  1 glass of wine daily Smoker:  no Diet:  Does not watch what she eats.  Does not keep hydrated. Exercise:  Not routine Depression/stress:  no Sleep hygiene:  okay Family history of headache:  father  PAST MEDICAL HISTORY: Past Medical History  Diagnosis Date  . Migraines     PAST SURGICAL HISTORY: Past Surgical History  Procedure Laterality Date  . Cesarean section      MEDICATIONS: Current Outpatient Prescriptions on File Prior to Visit  Medication Sig Dispense Refill  . aspirin-acetaminophen-caffeine (EXCEDRIN MIGRAINE) 250-250-65 MG per tablet Take by mouth every 6 (six) hours as needed for headache.    . ibuprofen (ADVIL,MOTRIN) 800 MG tablet Take 1 tablet (800 mg total) by mouth every 8 (eight) hours as needed. 30 tablet 5  . Levonorgestrel-Ethinyl Estradiol  (AMETHIA,CAMRESE) 0.15-0.03 &0.01 MG tablet Take 1 tablet by mouth daily. 1 Package 4   No current facility-administered medications on file prior to visit.    ALLERGIES: Allergies  Allergen Reactions  . Penicillins Itching    Penicillin IV during labor.    FAMILY HISTORY: Family History  Problem Relation Age of Onset  . Cancer Mother   . Asthma Sister     SOCIAL HISTORY: Social History   Social History  . Marital Status: Single    Spouse Name: N/A  . Number of Children: N/A  . Years of Education: N/A   Occupational History  . Not on file.   Social History Main Topics  . Smoking status: Former Smoker    Quit date: 11/09/2004  . Smokeless tobacco: Never Used  . Alcohol Use: 0.0 oz/week    0 Standard drinks or equivalent per week     Comment: 1 glass of wine every day  . Drug Use: No  . Sexual Activity:    Partners: Male    Birth Control/ Protection: Pill   Other Topics Concern  . Not on file   Social History Narrative    REVIEW OF SYSTEMS: Constitutional: No fevers, chills, or sweats, no generalized fatigue, change in appetite Eyes: No visual changes, double vision, eye pain Ear, nose and throat: No hearing loss, ear pain, nasal congestion, sore throat Cardiovascular: No chest pain, palpitations Respiratory:  No shortness of breath at rest or with exertion, wheezes GastrointestinaI: No nausea, vomiting, diarrhea, abdominal pain, fecal incontinence Genitourinary:  No dysuria, urinary retention or frequency Musculoskeletal:  No neck pain, back pain Integumentary: No rash, pruritus, skin  lesions Neurological: as above Psychiatric: No depression, insomnia, anxiety Endocrine: No palpitations, fatigue, diaphoresis, mood swings, change in appetite, change in weight, increased thirst Hematologic/Lymphatic:  No anemia, purpura, petechiae. Allergic/Immunologic: no itchy/runny eyes, nasal congestion, recent allergic reactions, rashes  PHYSICAL EXAM: Filed Vitals:    11/10/14 1242  BP: 130/80  Pulse: 78   General: No acute distress.  Patient appears well-groomed.  Head:  Normocephalic/atraumatic Eyes:  fundi unremarkable, without vessel changes, exudates, hemorrhages or papilledema. Neck: supple, no paraspinal tenderness, full range of motion Back: No paraspinal tenderness Heart: regular rate and rhythm Lungs: Clear to auscultation bilaterally. Vascular: No carotid bruits. Neurological Exam: Mental status: alert and oriented to person, place, and time, recent and remote memory intact, fund of knowledge intact, attention and concentration intact, speech fluent and not dysarthric, language intact. Cranial nerves: CN I: not tested CN II: pupils equal, round and reactive to light, visual fields intact, fundi unremarkable, without vessel changes, exudates, hemorrhages or papilledema. CN III, IV, VI:  full range of motion, no nystagmus, no ptosis CN V: facial sensation intact CN VII: upper and lower face symmetric CN VIII: hearing intact CN IX, X: gag intact, uvula midline CN XI: sternocleidomastoid and trapezius muscles intact CN XII: tongue midline Bulk & Tone: normal, no fasciculations. Motor:  5/5 throughout Sensation:  temperature and vibration sensation intact. Deep Tendon Reflexes:  2+ throughout, toes downgoing.  Finger to nose testing:  Without dysmetria.  Heel to shin:  Without dysmetria.  Gait:  Normal station and stride.  Able to turn and tandem walk. Romberg negative.  IMPRESSION: Episodic migraine without aura, not intractable.    PLAN: Appears stable.  No further medications required Excedrin as needed Follow up as needed.  Thank you for allowing me to take part in the care of this patient.  Shon Millet, DO  CC:  Franklyn Lor, CNM    ]

## 2014-11-13 ENCOUNTER — Other Ambulatory Visit: Payer: BLUE CROSS/BLUE SHIELD

## 2014-11-26 ENCOUNTER — Telehealth: Payer: Self-pay | Admitting: *Deleted

## 2014-11-26 NOTE — Telephone Encounter (Signed)
Patient contacted the office asking about her refills on her birth control. Patient advised to contact her pharmacy for refills. She should have refills at the pharmacy.

## 2014-11-27 ENCOUNTER — Other Ambulatory Visit: Payer: BLUE CROSS/BLUE SHIELD

## 2015-04-30 ENCOUNTER — Encounter: Payer: Self-pay | Admitting: Certified Nurse Midwife

## 2015-04-30 ENCOUNTER — Ambulatory Visit (INDEPENDENT_AMBULATORY_CARE_PROVIDER_SITE_OTHER): Payer: BLUE CROSS/BLUE SHIELD | Admitting: Certified Nurse Midwife

## 2015-04-30 VITALS — BP 130/93 | HR 89 | Temp 98.7°F | Wt 166.0 lb

## 2015-04-30 DIAGNOSIS — L309 Dermatitis, unspecified: Secondary | ICD-10-CM | POA: Diagnosis not present

## 2015-04-30 DIAGNOSIS — Z3041 Encounter for surveillance of contraceptive pills: Secondary | ICD-10-CM | POA: Diagnosis not present

## 2015-04-30 MED ORDER — LEVONORGEST-ETH ESTRAD 91-DAY 0.15-0.03 &0.01 MG PO TABS: 1.0000 | ORAL_TABLET | Freq: Every day | ORAL | Status: DC

## 2015-04-30 MED ORDER — TRIAMCINOLONE ACETONIDE 0.5 % EX OINT: 1.0000 "application " | TOPICAL_OINTMENT | Freq: Two times a day (BID) | CUTANEOUS | Status: AC

## 2015-04-30 NOTE — Progress Notes (Signed)
Patient ID: [name], female   DOB: 08/09/1980, 35 y.o.   MRN: [id number]  Chief Complaint  Patient presents with  . Rash    ? rash due to use of generic BC, pt does have appt scheduled with dermatology    HPI [name] is a 35 y.o. female.  Here for birth control management.  States that she is on pack 3 of generic Seasonique and started with a rash all over her body 2 months ago.  The rash is itchy, denies any break in skin.  The rash is worse on abdomen, arm and back.  Has a hx of eczema as a child.  Was doing well with Seasonique.  Has had a period that lasted 2 weeks and stopped on Sunday, was not due for a period for another 3 weeks.  This is the first time this  Bleeding has occurred.  Has been on this birth control for 9 months.  Reports taking it the same time every day and does not skip pills.  Denies any pets in the home, change in furniture, kids in the home do not have a rash, denies any change in soaps/detergents.  Denies any change in stress or other medications.  Denies any change in diet.  Has an appointment scheduled with dermatology in one week.    HPI  Past Medical History  Diagnosis Date  . Migraines     Past Surgical History  Procedure Laterality Date  . Cesarean section      Family History  Problem Relation Age of Onset  . Cancer Mother   . Asthma Sister     Social History Social History  Substance Use Topics  . Smoking status: Former Smoker    Quit date: 11/09/2004  . Smokeless tobacco: Never Used  . Alcohol Use: 0.0 oz/week    0 Standard drinks or equivalent per week     Comment: 1 glass of wine every day    Allergies  Allergen Reactions  . Penicillins Itching    Penicillin IV during labor.    Current Outpatient Prescriptions  Medication Sig Dispense Refill  . aspirin-acetaminophen-caffeine (EXCEDRIN MIGRAINE) 250-250-65 MG per tablet Take by mouth every 6 (six) hours as needed for headache. Reported on 04/30/2015    . ibuprofen  (ADVIL,MOTRIN) 800 MG tablet Take 1 tablet (800 mg total) by mouth every 8 (eight) hours as needed. (Patient not taking: Reported on 04/30/2015) 30 tablet 5  . Levonorgestrel-Ethinyl Estradiol (SEASONIQUE) 0.15-0.03 &0.01 MG tablet Take 1 tablet by mouth daily. 1 Package 4  . triamcinolone ointment (KENALOG) 0.5 % Apply 1 application topically 2 (two) times daily. 30 g 6   No current facility-administered medications for this visit.    Review of Systems Review of Systems Constitutional: negative for fatigue and weight loss Respiratory: negative for cough and wheezing Cardiovascular: negative for chest pain, fatigue and palpitations Gastrointestinal: negative for abdominal pain and change in bowel habits Genitourinary:negative Integument/breast: negative for nipple discharge, + generalized rash Musculoskeletal:negative for myalgias Neurological: negative for gait problems and tremors Behavioral/Psych: negative for abusive relationship, depression Endocrine: negative for temperature intolerance     Blood pressure 130/93, pulse 89, temperature 98.7 F (37.1 C), weight 166 lb (75.297 kg), last menstrual period 04/23/2015.  Physical Exam Physical Exam General:   alert  Skin:   + splotchy, irregular shaped, non-raised, non-erythemic lesions concentrated on abdomen, upper arms, buttocks and back.       50 -% of 15 min visit spent on counseling  and coordination of care.   Data Reviewed Previous medical hx, meds  Assessment     ?eczema     Plan    No orders of the defined types were placed in this encounter.   Meds ordered this encounter  Medications  . triamcinolone ointment (KENALOG) 0.5 %    Sig: Apply 1 application topically 2 (two) times daily.    Dispense:  30 g    Refill:  6  . Levonorgestrel-Ethinyl Estradiol (SEASONIQUE) 0.15-0.03 &0.01 MG tablet    Sig: Take 1 tablet by mouth daily.    Dispense:  1 Package    Refill:  4    No generic, gets a rash from generic pill     Possible management options include: another OCP Follow up as needed.

## 2015-05-04 ENCOUNTER — Telehealth: Payer: Self-pay | Admitting: *Deleted

## 2015-05-04 NOTE — Telephone Encounter (Signed)
Patient has a rash all over her body- she does have an appointment on Thursday with the Dermatologist. They did ask her if she had had labs done? She wants to know if she needs anything before her appointment. Told her I didn't think so- they will order whatever testing they need when she goes.

## 2015-08-10 ENCOUNTER — Other Ambulatory Visit: Payer: Self-pay | Admitting: Obstetrics

## 2015-08-10 NOTE — Telephone Encounter (Signed)
Ok to refill with 6 refills

## 2015-08-10 NOTE — Telephone Encounter (Signed)
Patient is requesting a refill of her Ibuprofen- her cycle is getting ready to start and that is what she uses it for.

## 2015-08-20 ENCOUNTER — Ambulatory Visit: Payer: BLUE CROSS/BLUE SHIELD | Admitting: Certified Nurse Midwife

## 2015-09-04 ENCOUNTER — Ambulatory Visit (INDEPENDENT_AMBULATORY_CARE_PROVIDER_SITE_OTHER): Payer: BLUE CROSS/BLUE SHIELD | Admitting: Obstetrics

## 2015-09-04 ENCOUNTER — Encounter: Payer: Self-pay | Admitting: Certified Nurse Midwife

## 2015-09-04 VITALS — BP 142/88 | HR 72 | Temp 98.7°F | Wt 166.9 lb

## 2015-09-04 DIAGNOSIS — Z01419 Encounter for gynecological examination (general) (routine) without abnormal findings: Secondary | ICD-10-CM

## 2015-09-04 DIAGNOSIS — Z3041 Encounter for surveillance of contraceptive pills: Secondary | ICD-10-CM

## 2015-09-04 DIAGNOSIS — Z1389 Encounter for screening for other disorder: Secondary | ICD-10-CM | POA: Diagnosis not present

## 2015-09-04 LAB — POCT URINALYSIS DIPSTICK
Bilirubin, UA: NEGATIVE
Blood, UA: NEGATIVE
GLUCOSE UA: NORMAL
KETONES UA: NEGATIVE
Leukocytes, UA: NEGATIVE
Nitrite, UA: NEGATIVE
Protein, UA: NEGATIVE
UROBILINOGEN UA: NEGATIVE
pH, UA: 7

## 2015-09-06 ENCOUNTER — Encounter: Payer: Self-pay | Admitting: Obstetrics

## 2015-09-06 NOTE — Progress Notes (Signed)
Subjective:        [name] is a 35 y.o. female here for a routine exam.  Current complaints: None.    Personal health questionnaire:  Is patient [name], have a family history of breast and/or ovarian cancer: no Is there a family history of uterine cancer diagnosed at age < 29, gastrointestinal cancer, urinary tract cancer, family member who is a Personnel officer syndrome-associated carrier: no Is the patient overweight and hypertensive, family history of diabetes, personal history of gestational diabetes, preeclampsia or PCOS: no Is patient over 25, have PCOS,  family history of premature CHD under age 74, diabetes, smoke, have hypertension or peripheral artery disease:  no At any time, has a partner hit, kicked or otherwise hurt or frightened you?: no Over the past 2 weeks, have you felt down, depressed or hopeless?: no Over the past 2 weeks, have you felt little interest or pleasure in doing things?:no   Gynecologic History No LMP recorded. Contraception: OCP (estrogen/progesterone) Last Pap: 2016. Results were: normal Last mammogram: n/a. Results were: n/a  Obstetric History OB History  Gravida Para Term Preterm AB SAB TAB Ectopic Multiple Living  # Outcome Date GA Lbr Len/2nd Weight Sex Delivery Anes PTL Lv  3 Term 09/07/09 [redacted]w[redacted]d  7 lb 9 oz (3.43 kg) F CS-LTranv EPI  Y  2 SAB 2010 [redacted]w[redacted]d       N  1 TAB 2010 [redacted]w[redacted]d       N      Past Medical History  Diagnosis Date  . Migraines     Past Surgical History  Procedure Laterality Date  . Cesarean section       Current outpatient prescriptions:  .  aspirin-acetaminophen-caffeine (EXCEDRIN MIGRAINE) 250-250-65 MG per tablet, Take by mouth every 6 (six) hours as needed for headache. Reported on 04/30/2015, Disp: , Rfl:  .  ibuprofen (ADVIL,MOTRIN) 800 MG tablet, TAKE 1 TABLET (800 MG TOTAL) BY MOUTH EVERY 8 (EIGHT) HOURS AS NEEDED., Disp: 30 tablet, Rfl: 4 .  Levonorgestrel-Ethinyl Estradiol  (SEASONIQUE) 0.15-0.03 &0.01 MG tablet, Take 1 tablet by mouth daily., Disp: 1 Package, Rfl: 4 .  triamcinolone ointment (KENALOG) 0.5 %, Apply 1 application topically 2 (two) times daily., Disp: 30 g, Rfl: 6 Allergies  Allergen Reactions  . Penicillins Itching    Penicillin IV during labor.    Social History  Substance Use Topics  . Smoking status: Former Smoker    Quit date: 11/09/2004  . Smokeless tobacco: Never Used  . Alcohol Use: 0.0 oz/week    0 Standard drinks or equivalent per week     Comment: 1 glass of wine every day    Family History  Problem Relation Age of Onset  . Cancer Mother   . Asthma Sister       Review of Systems  Constitutional: negative for fatigue and weight loss Respiratory: negative for cough and wheezing Cardiovascular: negative for chest pain, fatigue and palpitations Gastrointestinal: negative for abdominal pain and change in bowel habits Musculoskeletal:negative for myalgias Neurological: negative for gait problems and tremors Behavioral/Psych: negative for abusive relationship, depression Endocrine: negative for temperature intolerance   Genitourinary:negative for abnormal menstrual periods, genital lesions, hot flashes, sexual problems and vaginal discharge Integument/breast: negative for breast lump, breast tenderness, nipple discharge and skin lesion(s)    Objective:       BP 142/88 mmHg  Pulse 72  Temp(Src) 98.7 F (37.1 C) (  Oral)  Wt 166 lb 14.4 oz (75.705 kg) General:   alert  Skin:   no rash or abnormalities  Lungs:   clear to auscultation bilaterally  Heart:   regular rate and rhythm, S1, S2 normal, no murmur, click, rub or gallop  Breasts:   normal without suspicious masses, skin or nipple changes or axillary nodes  Abdomen:  normal findings: no organomegaly, soft, non-tender and no hernia  Pelvis:  External genitalia: normal general appearance Urinary system: urethral meatus normal and bladder without fullness,  nontender Vaginal: normal without tenderness, induration or masses Cervix: normal appearance Adnexa: normal bimanual exam Uterus: anteverted and non-tender, normal size   Lab Review Urine pregnancy test Labs reviewed yes Radiologic studies reviewed no    Assessment:    Healthy female exam.    Plan:    Education reviewed: calcium supplements, low fat, low cholesterol diet, safe sex/STD prevention, self breast exams and weight bearing exercise. Contraception: OCP (estrogen/progesterone). Follow up in: 1 year.   No orders of the defined types were placed in this encounter.   Orders Placed This Encounter  Procedures  . POCT urinalysis dipstick    Associate with diagnosis code Z13.89

## 2015-09-08 LAB — NUSWAB VG+, CANDIDA 6SP
CANDIDA ALBICANS, NAA: NEGATIVE
CANDIDA GLABRATA, NAA: NEGATIVE
CANDIDA LUSITANIAE, NAA: NEGATIVE
CANDIDA PARAPSILOSIS, NAA: NEGATIVE
CANDIDA TROPICALIS, NAA: NEGATIVE
Candida krusei, NAA: NEGATIVE
Chlamydia trachomatis, NAA: NEGATIVE
NEISSERIA GONORRHOEAE, NAA: NEGATIVE
Trich vag by NAA: NEGATIVE

## 2015-09-08 LAB — PAP IG AND HPV HIGH-RISK
HPV, HIGH-RISK: NEGATIVE
PAP Smear Comment: 0

## 2015-09-15 ENCOUNTER — Telehealth: Payer: Self-pay | Admitting: *Deleted

## 2015-09-15 NOTE — Telephone Encounter (Signed)
Patient called and left message that she is having BTB and cramping. Attempted to return call- LM on VM to CB

## 2015-11-15 ENCOUNTER — Other Ambulatory Visit: Payer: Self-pay | Admitting: Certified Nurse Midwife

## 2015-11-15 DIAGNOSIS — Z3041 Encounter for surveillance of contraceptive pills: Secondary | ICD-10-CM

## 2016-02-19 ENCOUNTER — Other Ambulatory Visit: Payer: Self-pay | Admitting: Obstetrics

## 2016-02-19 DIAGNOSIS — Z3041 Encounter for surveillance of contraceptive pills: Secondary | ICD-10-CM

## 2016-09-05 ENCOUNTER — Encounter: Payer: Self-pay | Admitting: Obstetrics

## 2016-09-05 ENCOUNTER — Ambulatory Visit (INDEPENDENT_AMBULATORY_CARE_PROVIDER_SITE_OTHER): Payer: BLUE CROSS/BLUE SHIELD | Admitting: Obstetrics

## 2016-09-05 VITALS — BP 130/82 | HR 83 | Ht 67.0 in | Wt 174.0 lb

## 2016-09-05 DIAGNOSIS — Z01419 Encounter for gynecological examination (general) (routine) without abnormal findings: Secondary | ICD-10-CM

## 2016-09-05 DIAGNOSIS — Z124 Encounter for screening for malignant neoplasm of cervix: Secondary | ICD-10-CM

## 2016-09-05 DIAGNOSIS — Z3041 Encounter for surveillance of contraceptive pills: Secondary | ICD-10-CM

## 2016-09-05 DIAGNOSIS — Z1151 Encounter for screening for human papillomavirus (HPV): Secondary | ICD-10-CM | POA: Diagnosis not present

## 2016-09-05 DIAGNOSIS — Z113 Encounter for screening for infections with a predominantly sexual mode of transmission: Secondary | ICD-10-CM

## 2016-09-05 DIAGNOSIS — N898 Other specified noninflammatory disorders of vagina: Secondary | ICD-10-CM

## 2016-09-05 DIAGNOSIS — N946 Dysmenorrhea, unspecified: Secondary | ICD-10-CM | POA: Insufficient documentation

## 2016-09-05 MED ORDER — IBUPROFEN 800 MG PO TABS: ORAL_TABLET | ORAL | 6 refills | Status: DC

## 2016-09-05 NOTE — Progress Notes (Signed)
Patient presents for Annual Exam, needs refill on pain meds.

## 2016-09-05 NOTE — Progress Notes (Signed)
Subjective:        [name] is a 36 y.o. female here for a routine exam.  Current complaints: None.    Personal health questionnaire:  Is patient [name], have a family history of breast and/or ovarian cancer: no Is there a family history of uterine cancer diagnosed at age < 59, gastrointestinal cancer, urinary tract cancer, family member who is a Personnel officer syndrome-associated carrier: no Is the patient overweight and hypertensive, family history of diabetes, personal history of gestational diabetes, preeclampsia or PCOS: no Is patient over 32, have PCOS,  family history of premature CHD under age 59, diabetes, smoke, have hypertension or peripheral artery disease:  no At any time, has a partner hit, kicked or otherwise hurt or frightened you?: no Over the past 2 weeks, have you felt down, depressed or hopeless?: no Over the past 2 weeks, have you felt little interest or pleasure in doing things?:no   Gynecologic History Patient's last menstrual period was 08/22/2016 (exact date). Contraception: OCP (estrogen/progesterone) Last Pap: 2017. Results were: normal Last mammogram: n/a. Results were: n/a  Obstetric History OB History  Gravida Para Term Preterm AB Living  3 1 1   2 1   SAB TAB Ectopic Multiple Live Births  1 1     1     # Outcome Date GA Lbr Len/2nd Weight Sex Delivery Anes PTL Lv  3 Term 09/07/09 [redacted]w[redacted]d  7 lb 9 oz (3.43 kg) F CS-LTranv EPI  LIV  2 SAB 2010 [redacted]w[redacted]d       DEC  1 TAB 2010 [redacted]w[redacted]d       DEC      Past Medical History:  Diagnosis Date  . Migraines     Past Surgical History:  Procedure Laterality Date  . CESAREAN SECTION       Current Outpatient Prescriptions:  .  aspirin-acetaminophen-caffeine (EXCEDRIN MIGRAINE) 250-250-65 MG per tablet, Take by mouth every 6 (six) hours as needed for headache. Reported on 04/30/2015, Disp: , Rfl:  .  CAMRESE 0.15-0.03 &0.01 MG tablet, TAKE 1 TABLET BY MOUTH EVERY DAY, Disp: 91 tablet, Rfl: 0 .  ibuprofen  (ADVIL,MOTRIN) 800 MG tablet, TAKE 1 TABLET (800 MG TOTAL) BY MOUTH EVERY 8 (EIGHT) HOURS AS NEEDED., Disp: 30 tablet, Rfl: 6 .  triamcinolone ointment (KENALOG) 0.5 %, Apply 1 application topically 2 (two) times daily. (Patient not taking: Reported on 09/05/2016), Disp: 30 g, Rfl: 6 Allergies  Allergen Reactions  . Penicillins Itching    Penicillin IV during labor.    Social History  Substance Use Topics  . Smoking status: Former Smoker    Quit date: 11/09/2004  . Smokeless tobacco: Never Used  . Alcohol use 0.0 oz/week     Comment: 1 glass of wine every day    Family History  Problem Relation Age of Onset  . Cancer Mother   . Asthma Sister       Review of Systems  Constitutional: negative for fatigue and weight loss Respiratory: negative for cough and wheezing Cardiovascular: negative for chest pain, fatigue and palpitations Gastrointestinal: negative for abdominal pain and change in bowel habits Musculoskeletal:negative for myalgias Neurological: negative for gait problems and tremors Behavioral/Psych: negative for abusive relationship, depression Endocrine: negative for temperature intolerance    Genitourinary:negative for abnormal menstrual periods, genital lesions, hot flashes, sexual problems and vaginal discharge Integument/breast: negative for breast lump, breast tenderness, nipple discharge and skin lesion(s)    Objective:       BP 130/82  Pulse 83   Ht 5\' 7"  (1.702 m)   Wt 174 lb (78.9 kg)   LMP 08/22/2016 (Exact Date)   BMI 27.25 kg/m  General:   alert  Skin:   no rash or abnormalities  Lungs:   clear to auscultation bilaterally  Heart:   regular rate and rhythm, S1, S2 normal, no murmur, click, rub or gallop  Breasts:   normal without suspicious masses, skin or nipple changes or axillary nodes  Abdomen:  normal findings: no organomegaly, soft, non-tender and no hernia  Pelvis:  External genitalia: normal general appearance Urinary system: urethral  meatus normal and bladder without fullness, nontender Vaginal: normal without tenderness, induration or masses Cervix: normal appearance Adnexa: normal bimanual exam Uterus: anteverted and non-tender, normal size   Lab Review Urine pregnancy test Labs reviewed yes Radiologic studies reviewed no  50% of 20 min visit spent on counseling and coordination of care.    Assessment:    Healthy female exam.    Plan:   1. Dysmenorrhea Rx - ibuprofen (ADVIL,MOTRIN) 800 MG tablet; TAKE 1 TABLET (800 MG TOTAL) BY MOUTH EVERY 8 (EIGHT) HOURS AS NEEDED.  Dispense: 30 tablet; Refill: 6  2. Encounter for surveillance of contraceptive pills Rx: - Cervicovaginal ancillary only  3. Encounter for routine gynecological examination with Papanicolaou smear of cervix Rx; - Cytology - PAP  4. Vaginal discharge Rx - Cervicovaginal ancillary only    Education reviewed: calcium supplements, depression evaluation, low fat, low cholesterol diet, safe sex/STD prevention, self breast exams and weight bearing exercise. Contraception: OCP (estrogen/progesterone). Follow up in: 1 year.   Meds ordered this encounter  Medications  . ibuprofen (ADVIL,MOTRIN) 800 MG tablet    Sig: TAKE 1 TABLET (800 MG TOTAL) BY MOUTH EVERY 8 (EIGHT) HOURS AS NEEDED.    Dispense:  30 tablet    Refill:  6   No orders of the defined types were placed in this encounter.    Patient ID: [name], female   DOB: 11/03/1980, 36 y.o.   MRN: [id number]

## 2016-09-06 ENCOUNTER — Other Ambulatory Visit: Payer: Self-pay | Admitting: Obstetrics

## 2016-09-06 LAB — CERVICOVAGINAL ANCILLARY ONLY
Chlamydia: NEGATIVE
NEISSERIA GONORRHEA: NEGATIVE

## 2016-09-07 LAB — CYTOLOGY - PAP
Diagnosis: NEGATIVE
HPV (WINDOPATH): NOT DETECTED

## 2016-11-17 ENCOUNTER — Other Ambulatory Visit: Payer: Self-pay | Admitting: Obstetrics

## 2016-11-17 DIAGNOSIS — Z3041 Encounter for surveillance of contraceptive pills: Secondary | ICD-10-CM

## 2017-05-18 ENCOUNTER — Other Ambulatory Visit: Payer: Self-pay | Admitting: Obstetrics

## 2017-05-18 DIAGNOSIS — Z3041 Encounter for surveillance of contraceptive pills: Secondary | ICD-10-CM

## 2017-05-19 ENCOUNTER — Other Ambulatory Visit: Payer: Self-pay

## 2017-05-19 DIAGNOSIS — Z3041 Encounter for surveillance of contraceptive pills: Secondary | ICD-10-CM

## 2017-05-19 MED ORDER — LEVONORGEST-ETH ESTRAD 91-DAY 0.15-0.03 &0.01 MG PO TABS: 1.0000 | ORAL_TABLET | Freq: Every day | ORAL | 1 refills | Status: DC

## 2017-05-19 NOTE — Progress Notes (Signed)
Called pt and [facility] that [facility]

## 2017-09-06 ENCOUNTER — Ambulatory Visit (INDEPENDENT_AMBULATORY_CARE_PROVIDER_SITE_OTHER): Payer: Managed Care, Other (non HMO) | Admitting: Obstetrics

## 2017-09-06 ENCOUNTER — Encounter: Payer: Self-pay | Admitting: Obstetrics

## 2017-09-06 ENCOUNTER — Other Ambulatory Visit: Payer: Self-pay | Admitting: Obstetrics

## 2017-09-06 VITALS — BP 146/89 | HR 72 | Ht 67.0 in | Wt 183.3 lb

## 2017-09-06 DIAGNOSIS — G43119 Migraine with aura, intractable, without status migrainosus: Secondary | ICD-10-CM

## 2017-09-06 DIAGNOSIS — Z01419 Encounter for gynecological examination (general) (routine) without abnormal findings: Secondary | ICD-10-CM | POA: Diagnosis not present

## 2017-09-06 DIAGNOSIS — Z113 Encounter for screening for infections with a predominantly sexual mode of transmission: Secondary | ICD-10-CM | POA: Diagnosis not present

## 2017-09-06 DIAGNOSIS — N946 Dysmenorrhea, unspecified: Secondary | ICD-10-CM

## 2017-09-06 DIAGNOSIS — Z1151 Encounter for screening for human papillomavirus (HPV): Secondary | ICD-10-CM

## 2017-09-06 DIAGNOSIS — Z124 Encounter for screening for malignant neoplasm of cervix: Secondary | ICD-10-CM

## 2017-09-06 DIAGNOSIS — Z3041 Encounter for surveillance of contraceptive pills: Secondary | ICD-10-CM

## 2017-09-06 MED ORDER — BUTALBITAL-APAP-CAFF-COD 50-300-40-30 MG PO CAPS: 1.0000 | ORAL_CAPSULE | Freq: Four times a day (QID) | ORAL | 2 refills | Status: DC | PRN

## 2017-09-06 MED ORDER — IBUPROFEN 800 MG PO TABS: ORAL_TABLET | ORAL | 6 refills | Status: AC

## 2017-09-06 MED ORDER — BUTALBITAL-APAP-CAFFEINE 50-325-40 MG PO TABS: 2.0000 | ORAL_TABLET | Freq: Four times a day (QID) | ORAL | 2 refills | Status: AC | PRN

## 2017-09-06 MED ORDER — LEVONORGEST-ETH ESTRAD 91-DAY 0.15-0.03 &0.01 MG PO TABS: 1.0000 | ORAL_TABLET | Freq: Every day | ORAL | 3 refills | Status: DC

## 2017-09-06 NOTE — Progress Notes (Signed)
Patient presents for her Annual Exam today.  LMP: In April per pt due to birth control  Contraception:Pills  STD Screening:GC/CT  Last pap: 09/05/2016 WNL   CC: None per pt  *pt noted HA may be reason B/P is elevated Hx of migraines.

## 2017-09-06 NOTE — Telephone Encounter (Signed)
Changed to 325 per pharmacy request

## 2017-09-06 NOTE — Addendum Note (Signed)
Addended by: Coral CeoHARPER, Kharlie Bring A on: 09/06/2017 01:42 PM   Modules accepted: Orders

## 2017-09-06 NOTE — Progress Notes (Signed)
Subjective:        [name] is a 37 y.o. female here for a routine exam.  Current complaints: None.    Personal health questionnaire:  Is patient [name], have a family history of breast and/or ovarian cancer: no Is there a family history of uterine cancer diagnosed at age < 3250, gastrointestinal cancer, urinary tract cancer, family member who is a Personnel officerLynch syndrome-associated carrier: no Is the patient overweight and hypertensive, family history of diabetes, personal history of gestational diabetes, preeclampsia or PCOS: no Is patient over 7755, have PCOS,  family history of premature CHD under age 39, diabetes, smoke, have hypertension or peripheral artery disease:  no At any time, has a partner hit, kicked or otherwise hurt or frightened you?: no Over the past 2 weeks, have you felt down, depressed or hopeless?: no Over the past 2 weeks, have you felt little interest or pleasure in doing things?:no   Gynecologic History No LMP recorded. Contraception: OCP (estrogen/progesterone) Last Pap: 2018. Results were: normal Last mammogram: n/a. Results were: n/a  Obstetric History OB History  Gravida Para Term Preterm AB Living  3 1 1   2 1   SAB TAB Ectopic Multiple Live Births  1 1     1     # Outcome Date GA Lbr Len/2nd Weight Sex Delivery Anes PTL Lv  3 Term 09/07/09 6014w0d  7 lb 9 oz (3.43 kg) F CS-LTranv EPI  LIV  2 SAB 2010 6572w0d       DEC  1 TAB 2010 3567w0d       DEC    Past Medical History:  Diagnosis Date  . Migraines     Past Surgical History:  Procedure Laterality Date  . CESAREAN SECTION       Current Outpatient Medications:  .  aspirin-acetaminophen-caffeine (EXCEDRIN MIGRAINE) 250-250-65 MG per tablet, Take by mouth every 6 (six) hours as needed for headache. Reported on 04/30/2015, Disp: , Rfl:  .  Butalbital-APAP-Caff-Cod (FIORICET/CODEINE) 50-300-40-30 MG CAPS, Take 1 capsule by mouth every 6 (six) hours as needed., Disp: 30 capsule, Rfl: 2 .   ibuprofen (ADVIL,MOTRIN) 800 MG tablet, TAKE 1 TABLET (800 MG TOTAL) BY MOUTH EVERY 8 (EIGHT) HOURS AS NEEDED., Disp: 30 tablet, Rfl: 6 .  triamcinolone ointment (KENALOG) 0.5 %, Apply 1 application topically 2 (two) times daily. (Patient not taking: Reported on 09/05/2016), Disp: 30 g, Rfl: 6 Allergies  Allergen Reactions  . Penicillins Itching    Penicillin IV during labor.    Social History   Tobacco Use  . Smoking status: Former Smoker    Last attempt to quit: 11/09/2004    Years since quitting: 12.8  . Smokeless tobacco: Never Used  Substance Use Topics  . Alcohol use: Yes    Alcohol/week: 0.0 oz    Comment: 1 glass of wine every day    Family History  Problem Relation Age of Onset  . Cancer Mother   . Asthma Sister       Review of Systems  Constitutional: negative for fatigue and weight loss Respiratory: negative for cough and wheezing Cardiovascular: negative for chest pain, fatigue and palpitations Gastrointestinal: negative for abdominal pain and change in bowel habits Musculoskeletal:negative for myalgias Neurological: Positive for migraines with aura Behavioral/Psych: negative for abusive relationship, depression Endocrine: negative for temperature intolerance    Genitourinary:negative for abnormal menstrual periods, genital lesions, hot flashes, sexual problems and vaginal discharge Integument/breast: negative for breast lump, breast tenderness, nipple discharge and skin lesion(s)  Objective:       BP (!) 146/89   Pulse 72   Ht 5\' 7"  (1.702 m)   Wt 183 lb 4.8 oz (83.1 kg)   BMI 28.71 kg/m  General:   alert  Skin:   no rash or abnormalities  Lungs:   clear to auscultation bilaterally  Heart:   regular rate and rhythm, S1, S2 normal, no murmur, click, rub or gallop  Breasts:   normal without suspicious masses, skin or nipple changes or axillary nodes  Abdomen:  normal findings: no organomegaly, soft, non-tender and no hernia  Pelvis:  External  genitalia: normal general appearance Urinary system: urethral meatus normal and bladder without fullness, nontender Vaginal: normal without tenderness, induration or masses Cervix: normal appearance Adnexa: normal bimanual exam Uterus: anteverted and non-tender, normal size   Lab Review Urine pregnancy test Labs reviewed yes Radiologic studies reviewed no  50% of 20 min visit spent on counseling and coordination of care.   Assessment:     1. Encounter for annual routine gynecological examination  2. Screening for cervical cancer Rx: - Cytology - PAP  3. Screening examination for STD (sexually transmitted disease) Rx: - Cervicovaginal ancillary only  4. Intractable migraine with aura without status migrainosus Rx: - Butalbital-APAP-Caff-Cod (FIORICET/CODEINE) 50-300-40-30 MG CAPS; Take 1 capsule by mouth every 6 (six) hours as needed.  Dispense: 30 capsule; Refill: 2  5. Dysmenorrhea Rx: - ibuprofen (ADVIL,MOTRIN) 800 MG tablet; TAKE 1 TABLET (800 MG TOTAL) BY MOUTH EVERY 8 (EIGHT) HOURS AS NEEDED.  Dispense: 30 tablet; Refill: 6  6. Encounter for surveillance of contraceptive pills - patient informed that she must discontinue OCP's because of Migraines with Aura - other non estrogen contraceptives discussed - she is considering ParaGuard IUD   Plan:    Education reviewed: calcium supplements, depression evaluation, low fat, low cholesterol diet, safe sex/STD prevention, self breast exams and weight bearing exercise. Contraception: OCP (estrogen/progesterone). Follow up in: 1 year.  Will follow up during period for ParaGuard IUD insertion  Meds ordered this encounter  Medications  . Butalbital-APAP-Caff-Cod (FIORICET/CODEINE) 50-300-40-30 MG CAPS    Sig: Take 1 capsule by mouth every 6 (six) hours as needed.    Dispense:  30 capsule    Refill:  2  . ibuprofen (ADVIL,MOTRIN) 800 MG tablet    Sig: TAKE 1 TABLET (800 MG TOTAL) BY MOUTH EVERY 8 (EIGHT) HOURS AS NEEDED.     Dispense:  30 tablet    Refill:  6  . DISCONTD: Levonorgestrel-Ethinyl Estradiol (CAMRESE) 0.15-0.03 &0.01 MG tablet    Sig: Take 1 tablet by mouth daily.    Dispense:  91 tablet    Refill:  3   No orders of the defined types were placed in this encounter.   Brock Bad MD 09-06-2017

## 2017-09-07 ENCOUNTER — Other Ambulatory Visit: Payer: Self-pay

## 2017-09-07 LAB — CERVICOVAGINAL ANCILLARY ONLY
Bacterial vaginitis: NEGATIVE
CHLAMYDIA, DNA PROBE: NEGATIVE
Candida vaginitis: NEGATIVE
NEISSERIA GONORRHEA: NEGATIVE
Trichomonas: NEGATIVE

## 2017-09-07 LAB — CYTOLOGY - PAP
Diagnosis: NEGATIVE
HPV: NOT DETECTED

## 2017-11-15 ENCOUNTER — Other Ambulatory Visit: Payer: Self-pay | Admitting: Obstetrics

## 2017-11-29 ENCOUNTER — Other Ambulatory Visit: Payer: Self-pay | Admitting: Family Medicine

## 2017-11-29 ENCOUNTER — Ambulatory Visit
Admission: RE | Admit: 2017-11-29 | Discharge: 2017-11-29 | Disposition: A | Discharge status: Home / Self Care | Payer: Managed Care, Other (non HMO) | Source: Ambulatory Visit | Attending: Family Medicine | Admitting: Family Medicine

## 2017-11-29 ENCOUNTER — Encounter: Payer: Self-pay | Admitting: Radiology

## 2017-11-29 DIAGNOSIS — R1031 Right lower quadrant pain: Secondary | ICD-10-CM

## 2017-11-29 MED ORDER — IOPAMIDOL (ISOVUE-300) INJECTION 61%
100.0000 mL | Freq: Once | INTRAVENOUS | Status: AC | PRN
  Administered 2017-11-29: 100 mL via INTRAVENOUS

## 2017-11-30 ENCOUNTER — Other Ambulatory Visit: Payer: Self-pay | Admitting: Family Medicine

## 2017-12-07 ENCOUNTER — Other Ambulatory Visit: Payer: Self-pay | Admitting: Family Medicine

## 2017-12-07 DIAGNOSIS — N9489 Other specified conditions associated with female genital organs and menstrual cycle: Secondary | ICD-10-CM

## 2018-02-12 ENCOUNTER — Inpatient Hospital Stay
Admission: RE | Admit: 2018-02-12 | Discharge: 2018-02-12 | Disposition: A | Discharge status: Home / Self Care | Payer: Managed Care, Other (non HMO) | Source: Ambulatory Visit | Attending: Family Medicine | Admitting: Family Medicine

## 2018-09-08 ENCOUNTER — Other Ambulatory Visit: Payer: Self-pay | Admitting: Family Medicine

## 2018-09-08 DIAGNOSIS — N9489 Other specified conditions associated with female genital organs and menstrual cycle: Secondary | ICD-10-CM

## 2018-10-03 ENCOUNTER — Ambulatory Visit
Admission: RE | Admit: 2018-10-03 | Discharge: 2018-10-03 | Disposition: A | Discharge status: Home / Self Care | Payer: Managed Care, Other (non HMO) | Source: Ambulatory Visit | Attending: Family Medicine | Admitting: Family Medicine

## 2018-10-03 DIAGNOSIS — N9489 Other specified conditions associated with female genital organs and menstrual cycle: Secondary | ICD-10-CM

## 2018-10-05 ENCOUNTER — Other Ambulatory Visit: Payer: Self-pay | Admitting: Obstetrics

## 2018-10-05 DIAGNOSIS — N946 Dysmenorrhea, unspecified: Secondary | ICD-10-CM

## 2018-12-19 ENCOUNTER — Other Ambulatory Visit: Payer: Self-pay | Admitting: Family Medicine

## 2018-12-19 DIAGNOSIS — Z1231 Encounter for screening mammogram for malignant neoplasm of breast: Secondary | ICD-10-CM

## 2018-12-20 ENCOUNTER — Ambulatory Visit
Admission: RE | Admit: 2018-12-20 | Discharge: 2018-12-20 | Disposition: A | Discharge status: Home / Self Care | Payer: Managed Care, Other (non HMO) | Source: Ambulatory Visit | Attending: Family Medicine | Admitting: Family Medicine

## 2018-12-20 ENCOUNTER — Other Ambulatory Visit: Payer: Self-pay

## 2018-12-20 DIAGNOSIS — Z1231 Encounter for screening mammogram for malignant neoplasm of breast: Secondary | ICD-10-CM

## 2019-03-04 DIAGNOSIS — Z20822 Contact with and (suspected) exposure to covid-19: Secondary | ICD-10-CM | POA: Diagnosis not present

## 2019-04-12 DIAGNOSIS — N898 Other specified noninflammatory disorders of vagina: Secondary | ICD-10-CM | POA: Diagnosis not present

## 2019-04-12 DIAGNOSIS — R030 Elevated blood-pressure reading, without diagnosis of hypertension: Secondary | ICD-10-CM | POA: Diagnosis not present

## 2019-05-21 DIAGNOSIS — I10 Essential (primary) hypertension: Secondary | ICD-10-CM | POA: Diagnosis not present

## 2019-07-23 DIAGNOSIS — Z20822 Contact with and (suspected) exposure to covid-19: Secondary | ICD-10-CM | POA: Diagnosis not present

## 2019-08-14 DIAGNOSIS — I10 Essential (primary) hypertension: Secondary | ICD-10-CM | POA: Diagnosis not present

## 2019-08-14 DIAGNOSIS — R634 Abnormal weight loss: Secondary | ICD-10-CM | POA: Diagnosis not present

## 2019-09-26 ENCOUNTER — Other Ambulatory Visit (HOSPITAL_COMMUNITY)
Admission: RE | Admit: 2019-09-26 | Discharge: 2019-09-26 | Disposition: A | Discharge status: Home / Self Care | Payer: Self-pay | Source: Ambulatory Visit | Attending: Family Medicine | Admitting: Family Medicine

## 2019-09-26 ENCOUNTER — Other Ambulatory Visit: Payer: Self-pay | Admitting: Family Medicine

## 2019-09-26 DIAGNOSIS — Z0000 Encounter for general adult medical examination without abnormal findings: Secondary | ICD-10-CM | POA: Diagnosis not present

## 2019-09-26 DIAGNOSIS — Z01411 Encounter for gynecological examination (general) (routine) with abnormal findings: Secondary | ICD-10-CM | POA: Insufficient documentation

## 2019-09-26 DIAGNOSIS — N898 Other specified noninflammatory disorders of vagina: Secondary | ICD-10-CM | POA: Diagnosis not present

## 2019-10-01 DIAGNOSIS — Z113 Encounter for screening for infections with a predominantly sexual mode of transmission: Secondary | ICD-10-CM | POA: Diagnosis not present

## 2019-10-01 DIAGNOSIS — D649 Anemia, unspecified: Secondary | ICD-10-CM | POA: Diagnosis not present

## 2019-10-01 DIAGNOSIS — Z23 Encounter for immunization: Secondary | ICD-10-CM | POA: Diagnosis not present

## 2019-10-01 DIAGNOSIS — I10 Essential (primary) hypertension: Secondary | ICD-10-CM | POA: Diagnosis not present

## 2019-10-01 DIAGNOSIS — Z0000 Encounter for general adult medical examination without abnormal findings: Secondary | ICD-10-CM | POA: Diagnosis not present

## 2019-10-01 DIAGNOSIS — Z13220 Encounter for screening for lipoid disorders: Secondary | ICD-10-CM | POA: Diagnosis not present

## 2019-10-01 LAB — CYTOLOGY - PAP
Comment: NEGATIVE
Diagnosis: NEGATIVE
High risk HPV: NEGATIVE

## 2019-11-26 ENCOUNTER — Other Ambulatory Visit: Payer: Self-pay | Admitting: Family Medicine

## 2019-11-26 DIAGNOSIS — Z1231 Encounter for screening mammogram for malignant neoplasm of breast: Secondary | ICD-10-CM

## 2019-12-24 ENCOUNTER — Other Ambulatory Visit: Payer: Self-pay

## 2019-12-24 ENCOUNTER — Ambulatory Visit
Admission: RE | Admit: 2019-12-24 | Discharge: 2019-12-24 | Disposition: A | Discharge status: Home / Self Care | Payer: BC Managed Care – PPO | Source: Ambulatory Visit | Attending: Family Medicine | Admitting: Family Medicine

## 2019-12-24 DIAGNOSIS — Z1231 Encounter for screening mammogram for malignant neoplasm of breast: Secondary | ICD-10-CM

## 2020-02-27 DIAGNOSIS — N760 Acute vaginitis: Secondary | ICD-10-CM | POA: Diagnosis not present

## 2020-04-03 DIAGNOSIS — N898 Other specified noninflammatory disorders of vagina: Secondary | ICD-10-CM | POA: Diagnosis not present

## 2020-04-09 ENCOUNTER — Other Ambulatory Visit: Payer: Self-pay | Admitting: Family Medicine

## 2020-04-09 DIAGNOSIS — N898 Other specified noninflammatory disorders of vagina: Secondary | ICD-10-CM

## 2020-04-13 ENCOUNTER — Ambulatory Visit
Admission: RE | Admit: 2020-04-13 | Discharge: 2020-04-13 | Disposition: A | Discharge status: Home / Self Care | Payer: BC Managed Care – PPO | Source: Ambulatory Visit | Attending: Family Medicine | Admitting: Family Medicine

## 2020-04-13 DIAGNOSIS — D259 Leiomyoma of uterus, unspecified: Secondary | ICD-10-CM | POA: Diagnosis not present

## 2020-04-13 DIAGNOSIS — N858 Other specified noninflammatory disorders of uterus: Secondary | ICD-10-CM | POA: Diagnosis not present

## 2020-04-13 DIAGNOSIS — N898 Other specified noninflammatory disorders of vagina: Secondary | ICD-10-CM

## 2020-05-06 DIAGNOSIS — D259 Leiomyoma of uterus, unspecified: Secondary | ICD-10-CM | POA: Diagnosis not present

## 2020-08-28 DIAGNOSIS — N939 Abnormal uterine and vaginal bleeding, unspecified: Secondary | ICD-10-CM | POA: Diagnosis not present

## 2020-08-28 DIAGNOSIS — Z3202 Encounter for pregnancy test, result negative: Secondary | ICD-10-CM | POA: Diagnosis not present

## 2020-08-28 DIAGNOSIS — N898 Other specified noninflammatory disorders of vagina: Secondary | ICD-10-CM | POA: Diagnosis not present

## 2020-08-31 DIAGNOSIS — R55 Syncope and collapse: Secondary | ICD-10-CM | POA: Diagnosis not present

## 2020-08-31 DIAGNOSIS — I10 Essential (primary) hypertension: Secondary | ICD-10-CM | POA: Diagnosis not present

## 2020-09-02 DIAGNOSIS — N921 Excessive and frequent menstruation with irregular cycle: Secondary | ICD-10-CM | POA: Diagnosis not present

## 2020-09-29 DIAGNOSIS — Z13220 Encounter for screening for lipoid disorders: Secondary | ICD-10-CM | POA: Diagnosis not present

## 2020-09-29 DIAGNOSIS — Z0000 Encounter for general adult medical examination without abnormal findings: Secondary | ICD-10-CM | POA: Diagnosis not present

## 2020-10-02 DIAGNOSIS — Z0000 Encounter for general adult medical examination without abnormal findings: Secondary | ICD-10-CM | POA: Diagnosis not present

## 2020-11-25 ENCOUNTER — Encounter: Payer: Self-pay | Admitting: Interventional Cardiology

## 2020-11-25 ENCOUNTER — Other Ambulatory Visit: Payer: Self-pay

## 2020-11-25 ENCOUNTER — Ambulatory Visit (INDEPENDENT_AMBULATORY_CARE_PROVIDER_SITE_OTHER): Payer: BC Managed Care – PPO | Admitting: Interventional Cardiology

## 2020-11-25 VITALS — BP 136/70 | HR 75 | Ht 67.0 in | Wt 166.0 lb

## 2020-11-25 DIAGNOSIS — R011 Cardiac murmur, unspecified: Secondary | ICD-10-CM | POA: Diagnosis not present

## 2020-11-25 DIAGNOSIS — R42 Dizziness and giddiness: Secondary | ICD-10-CM

## 2020-11-25 DIAGNOSIS — I10 Essential (primary) hypertension: Secondary | ICD-10-CM

## 2020-11-25 DIAGNOSIS — R55 Syncope and collapse: Secondary | ICD-10-CM | POA: Diagnosis not present

## 2020-11-25 NOTE — Patient Instructions (Signed)
Medication Instructions:  Your physician recommends that you continue on your current medications as directed. Please refer to the Current Medication list given to you today.  *If you need a refill on your cardiac medications before your next appointment, please call your pharmacy*   Lab Work: none If you have labs (blood work) drawn today and your tests are completely normal, you will receive your results only by: MyChart Message (if you have MyChart) OR A paper copy in the mail If you have any lab test that is abnormal or we need to change your treatment, we will call you to review the results.   Testing/Procedures: Your physician has requested that you have an echocardiogram. Echocardiography is a painless test that uses sound waves to create images of your heart. It provides your doctor with information about the size and shape of your heart and how well your heart's chambers and valves are working. This procedure takes approximately one hour. There are no restrictions for this procedure.    Follow-Up: At [facility], you and your health needs are our priority.  As part of our continuing mission to provide you with exceptional heart care, we have created designated Provider Care Teams.  These Care Teams include your primary Cardiologist (physician) and Advanced Practice Providers (APPs -  Physician Assistants and Nurse Practitioners) who all work together to provide you with the care you need, when you need it.  We recommend signing up for the patient portal called "MyChart".  Sign up information is provided on this After Visit Summary.  MyChart is used to connect with patients for Virtual Visits (Telemedicine).  Patients are able to view lab/test results, encounter notes, upcoming appointments, etc.  Non-urgent messages can be sent to your provider as well.   To learn more about what you can do with MyChart, go to [email or web address].    Your next appointment:   As needed based  on test results  The format for your next appointment:   In Person  Provider:   You may see Lance Muss, MD or one of the following Advanced Practice Providers on your designated Care Team:   Ronie Spies, PA-C Jacolyn Reedy, PA-C   Other Instructions

## 2020-11-25 NOTE — Progress Notes (Signed)
Cardiology Office Note   Date:  11/25/2020   ID:  [name], [name] [birth year], MRN [id number]  PCP:  Farris Has, MD    No chief complaint on file.  presyncope  Wt Readings from Last 3 Encounters:  11/25/20 166 lb (75.3 kg)  09/06/17 183 lb 4.8 oz (83.1 kg)  09/05/16 174 lb (78.9 kg)       History of Present Illness: [name] is a 40 y.o. female who is being seen today for the evaluation of presyncope at the request of Farris Has, MD.   She has had presyncope associated with palpitations.  First episode occurred 2 months ago.  SHe was standing and felt dizzy and knew she had to sit down.  She started sweating.  She drank some water. She was better after 15-20 minutes. She did sit and then ultimately lie down.   Second episode, she was at home.  She had similar warning signs and then sat down.  No syncope.  Felt better with deep breathing.  Had some anxiety.  Denies : Chest pain. Dizziness. Leg edema. Nitroglycerin use. Orthopnea. Palpitations. Paroxysmal nocturnal dyspnea. Shortness of breath. Syncope.    Past Medical History:  Diagnosis Date   Migraines     Past Surgical History:  Procedure Laterality Date   CESAREAN SECTION       Current Outpatient Medications  Medication Sig Dispense Refill   amLODipine (NORVASC) 5 MG tablet Take 5 mg by mouth daily.     ibuprofen (ADVIL,MOTRIN) 800 MG tablet TAKE 1 TABLET (800 MG TOTAL) BY MOUTH EVERY 8 (EIGHT) HOURS AS NEEDED. 30 tablet 6   Norgestimate-Eth Estradiol (SPRINTEC 28 PO) Take by mouth as directed.     aspirin-acetaminophen-caffeine (EXCEDRIN MIGRAINE) 250-250-65 MG per tablet Take by mouth every 6 (six) hours as needed for headache. Reported on 04/30/2015 (Patient not taking: Reported on 11/25/2020)     butalbital-acetaminophen-caffeine (FIORICET WITH CODEINE) 50-325-40-30 MG capsule Please specify directions, refills and quantity (Patient not taking: Reported on 11/25/2020) 1 capsule 0   CAMRESE  0.15-0.03 &0.01 MG tablet TAKE 1 TABLET BY MOUTH EVERY DAY (Patient not taking: Reported on 11/25/2020) 91 tablet 1   norgestimate-ethinyl estradiol (ORTHO-CYCLEN) 0.25-35 MG-MCG tablet Take 1 tablet by mouth daily. (Patient not taking: Reported on 11/25/2020)     triamcinolone ointment (KENALOG) 0.5 % Apply 1 application topically 2 (two) times daily. (Patient not taking: No sig reported) 30 g 6   No current facility-administered medications for this visit.    Allergies:   Penicillins    Social History:  The patient  reports that she quit smoking about 16 years ago. Her smoking use included cigarettes. She has never used smokeless tobacco. She reports current alcohol use. She reports that she does not use drugs.   Family History:  The patient's family history includes Asthma in her sister; Cancer in her mother.    ROS:  Please see the history of present illness.   Otherwise, review of systems are positive for dizziness.   All other systems are reviewed and negative.    PHYSICAL EXAM: VS:  BP 136/70   Pulse 75   Ht 5\' 7"  (1.702 m)   Wt 166 lb (75.3 kg)   SpO2 99%   BMI 26.00 kg/m  , BMI Body mass index is 26 kg/m. GEN: Well nourished, well developed, in no acute distress HEENT: normal Neck: no JVD, carotid bruits, or masses Cardiac: RRR; 2/6 early systolic murmur, no rubs, or  gallops,no edema  Respiratory:  clear to auscultation bilaterally, normal work of breathing GI: soft, nontender, nondistended, + BS MS: no deformity or atrophy Skin: warm and dry, no rash Neuro:  Strength and sensation are intact Psych: euthymic mood, full affect   EKG:   The ekg ordered today demonstrates normal sinus rhythm, no ST changes   Recent Labs: No results found for requested labs within last 8760 hours.   Lipid Panel    Component Value Date/Time   CHOL 124 08/12/2014 1305   TRIG 94 08/12/2014 1305   HDL 40 (L) 08/12/2014 1305     Other studies Reviewed: Additional studies/ records  that were reviewed today with results demonstrating: LDL 95 in 2022.   ASSESSMENT AND PLAN:  Presyncope: plan echo.  Stay hydrated.  Decrease caffeine to 1 drink a day.  Sounds like a vagal episode.  She has a warning and sweats prior to really needing to sit down.  She has not fully passed out. HTN: The current medical regimen is effective;  continue present plan and medications. Palpitations: We could consider putting a heart monitor on to look for irregular heart rhythms. Murmur: soft systolic murmur on exam.  Echo will also help to determine if there is any structural heart disease that would predispose to presyncope.   Current medicines are reviewed at length with the patient today.  The patient concerns regarding her medicines were addressed.  The following changes have been made:  No change  Labs/ tests ordered today include:  No orders of the defined types were placed in this encounter.   Recommend 150 minutes/week of aerobic exercise Low fat, low carb, high fiber diet recommended  Disposition:   FU for echo   Signed, Lance Muss, MD  11/25/2020 2:02 PM    [facility] [address] [place], [place], [place]  [address] Phone: [phone]; Fax: [phone]

## 2020-11-30 DIAGNOSIS — Z3041 Encounter for surveillance of contraceptive pills: Secondary | ICD-10-CM | POA: Diagnosis not present

## 2020-12-21 ENCOUNTER — Encounter (HOSPITAL_COMMUNITY): Payer: Self-pay

## 2020-12-21 ENCOUNTER — Ambulatory Visit (HOSPITAL_COMMUNITY): Payer: BC Managed Care – PPO | Attending: Internal Medicine

## 2020-12-22 ENCOUNTER — Encounter (HOSPITAL_COMMUNITY): Payer: Self-pay | Admitting: Interventional Cardiology

## 2020-12-22 DIAGNOSIS — Z20822 Contact with and (suspected) exposure to covid-19: Secondary | ICD-10-CM | POA: Diagnosis not present

## 2020-12-30 DIAGNOSIS — N921 Excessive and frequent menstruation with irregular cycle: Secondary | ICD-10-CM | POA: Diagnosis not present

## 2020-12-30 DIAGNOSIS — Z3202 Encounter for pregnancy test, result negative: Secondary | ICD-10-CM | POA: Diagnosis not present

## 2021-03-02 DIAGNOSIS — J209 Acute bronchitis, unspecified: Secondary | ICD-10-CM | POA: Diagnosis not present

## 2021-03-02 DIAGNOSIS — R059 Cough, unspecified: Secondary | ICD-10-CM | POA: Diagnosis not present

## 2021-03-04 DIAGNOSIS — N898 Other specified noninflammatory disorders of vagina: Secondary | ICD-10-CM | POA: Diagnosis not present

## 2021-05-19 DIAGNOSIS — R6881 Early satiety: Secondary | ICD-10-CM | POA: Diagnosis not present

## 2021-06-16 DIAGNOSIS — Z01419 Encounter for gynecological examination (general) (routine) without abnormal findings: Secondary | ICD-10-CM | POA: Diagnosis not present

## 2021-06-16 DIAGNOSIS — Z113 Encounter for screening for infections with a predominantly sexual mode of transmission: Secondary | ICD-10-CM | POA: Diagnosis not present

## 2021-07-20 ENCOUNTER — Other Ambulatory Visit: Payer: Self-pay | Admitting: Family Medicine

## 2021-07-20 ENCOUNTER — Other Ambulatory Visit: Payer: Self-pay | Admitting: Nurse Practitioner

## 2021-07-20 DIAGNOSIS — Z1231 Encounter for screening mammogram for malignant neoplasm of breast: Secondary | ICD-10-CM

## 2021-07-28 ENCOUNTER — Ambulatory Visit
Admission: RE | Admit: 2021-07-28 | Discharge: 2021-07-28 | Disposition: A | Discharge status: Home / Self Care | Payer: BC Managed Care – PPO | Source: Ambulatory Visit | Attending: Nurse Practitioner | Admitting: Nurse Practitioner

## 2021-07-28 DIAGNOSIS — Z1231 Encounter for screening mammogram for malignant neoplasm of breast: Secondary | ICD-10-CM | POA: Diagnosis not present

## 2021-08-17 DIAGNOSIS — L814 Other melanin hyperpigmentation: Secondary | ICD-10-CM | POA: Diagnosis not present

## 2021-08-17 DIAGNOSIS — D225 Melanocytic nevi of trunk: Secondary | ICD-10-CM | POA: Diagnosis not present

## 2021-08-17 DIAGNOSIS — L2089 Other atopic dermatitis: Secondary | ICD-10-CM | POA: Diagnosis not present

## 2021-08-17 DIAGNOSIS — L821 Other seborrheic keratosis: Secondary | ICD-10-CM | POA: Diagnosis not present

## 2021-09-15 DIAGNOSIS — A599 Trichomoniasis, unspecified: Secondary | ICD-10-CM | POA: Diagnosis not present

## 2021-09-15 DIAGNOSIS — Z3041 Encounter for surveillance of contraceptive pills: Secondary | ICD-10-CM | POA: Diagnosis not present

## 2021-10-13 DIAGNOSIS — Z13220 Encounter for screening for lipoid disorders: Secondary | ICD-10-CM | POA: Diagnosis not present

## 2021-10-13 DIAGNOSIS — Z0000 Encounter for general adult medical examination without abnormal findings: Secondary | ICD-10-CM | POA: Diagnosis not present

## 2021-10-13 DIAGNOSIS — I10 Essential (primary) hypertension: Secondary | ICD-10-CM | POA: Diagnosis not present

## 2021-10-26 DIAGNOSIS — I10 Essential (primary) hypertension: Secondary | ICD-10-CM | POA: Diagnosis not present

## 2021-10-26 DIAGNOSIS — Z23 Encounter for immunization: Secondary | ICD-10-CM | POA: Diagnosis not present

## 2021-10-26 DIAGNOSIS — Z0000 Encounter for general adult medical examination without abnormal findings: Secondary | ICD-10-CM | POA: Diagnosis not present

## 2021-12-27 DIAGNOSIS — N898 Other specified noninflammatory disorders of vagina: Secondary | ICD-10-CM | POA: Diagnosis not present

## 2021-12-27 DIAGNOSIS — Z7251 High risk heterosexual behavior: Secondary | ICD-10-CM | POA: Diagnosis not present

## 2021-12-27 DIAGNOSIS — Z9189 Other specified personal risk factors, not elsewhere classified: Secondary | ICD-10-CM | POA: Diagnosis not present

## 2022-08-11 ENCOUNTER — Other Ambulatory Visit: Payer: Self-pay | Admitting: Nurse Practitioner

## 2022-08-11 DIAGNOSIS — Z1231 Encounter for screening mammogram for malignant neoplasm of breast: Secondary | ICD-10-CM

## 2022-08-12 ENCOUNTER — Ambulatory Visit
Admission: RE | Admit: 2022-08-12 | Discharge: 2022-08-12 | Disposition: A | Discharge status: Home / Self Care | Payer: Medicaid Other | Source: Ambulatory Visit | Attending: Nurse Practitioner | Admitting: Nurse Practitioner

## 2022-08-12 DIAGNOSIS — Z1231 Encounter for screening mammogram for malignant neoplasm of breast: Secondary | ICD-10-CM

## 2022-08-17 ENCOUNTER — Other Ambulatory Visit: Payer: Self-pay | Admitting: Nurse Practitioner

## 2022-08-17 DIAGNOSIS — R928 Other abnormal and inconclusive findings on diagnostic imaging of breast: Secondary | ICD-10-CM

## 2022-08-18 ENCOUNTER — Ambulatory Visit
Admission: RE | Admit: 2022-08-18 | Discharge: 2022-08-18 | Disposition: A | Discharge status: Home / Self Care | Payer: 59 | Source: Ambulatory Visit | Attending: Nurse Practitioner | Admitting: Nurse Practitioner

## 2022-08-18 ENCOUNTER — Ambulatory Visit
Admission: RE | Admit: 2022-08-18 | Discharge: 2022-08-18 | Disposition: A | Discharge status: Home / Self Care | Payer: Medicaid Other | Source: Ambulatory Visit | Attending: Nurse Practitioner | Admitting: Nurse Practitioner

## 2022-08-18 DIAGNOSIS — R928 Other abnormal and inconclusive findings on diagnostic imaging of breast: Secondary | ICD-10-CM

## 2022-10-10 IMAGING — MG MM DIGITAL SCREENING BILAT W/ TOMO AND CAD
8 series · 9 of 24 positions shown · non-contrast
Comparison: Previous exam(s).

CLINICAL DATA: Screening.

EXAM:
DIGITAL SCREENING BILATERAL MAMMOGRAM WITH TOMOSYNTHESIS AND CAD
TECHNIQUE: Bilateral screening digital craniocaudal and mediolateral oblique
mammograms were obtained. Bilateral screening digital breast
tomosynthesis was performed. The images were evaluated with
computer-aided detection.

[L CC synth-2D]
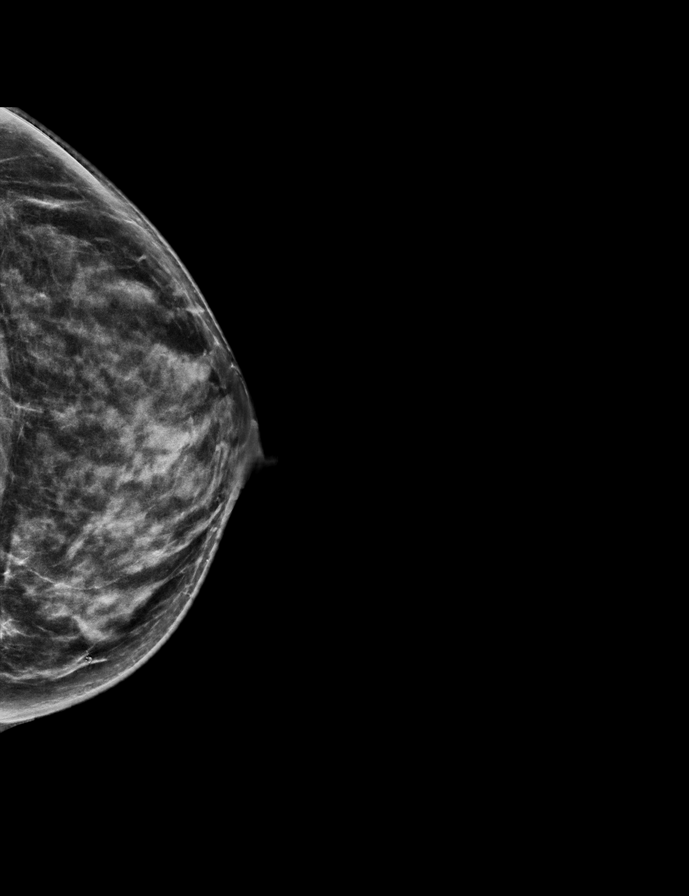

[R MLO synth-2D]
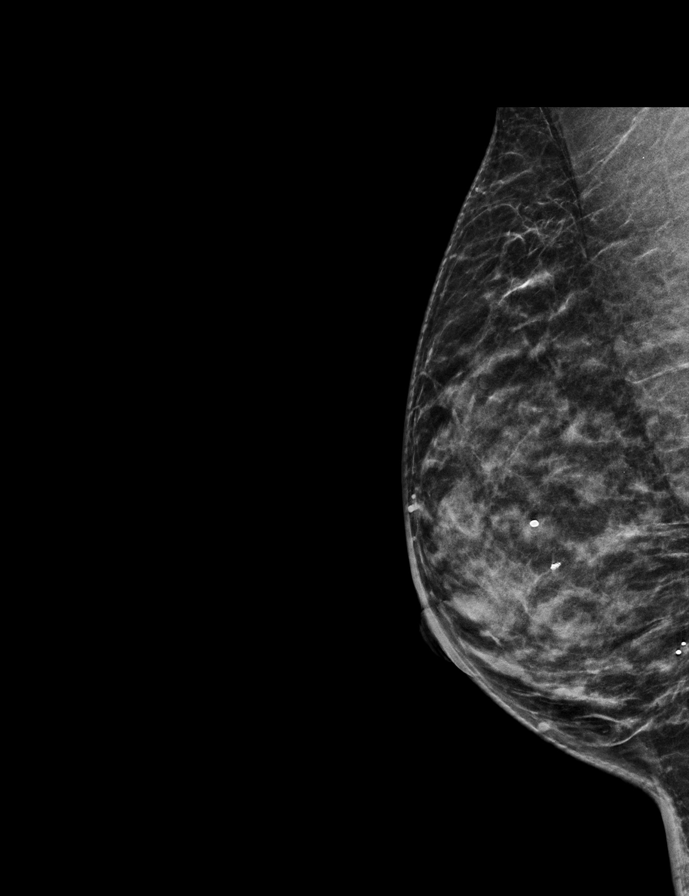

[L MLO synth-2D]
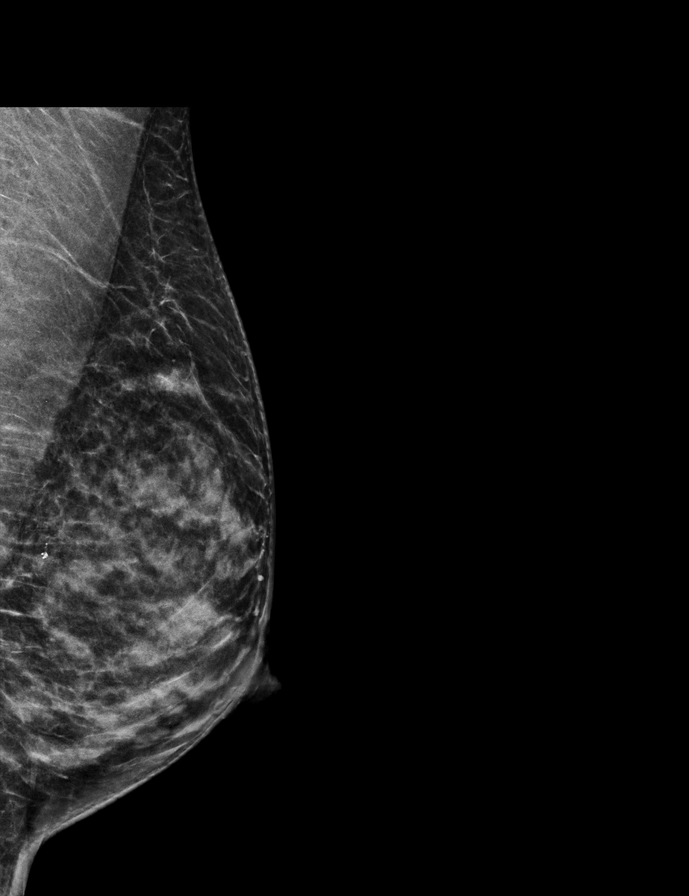

[R CC synth-2D]
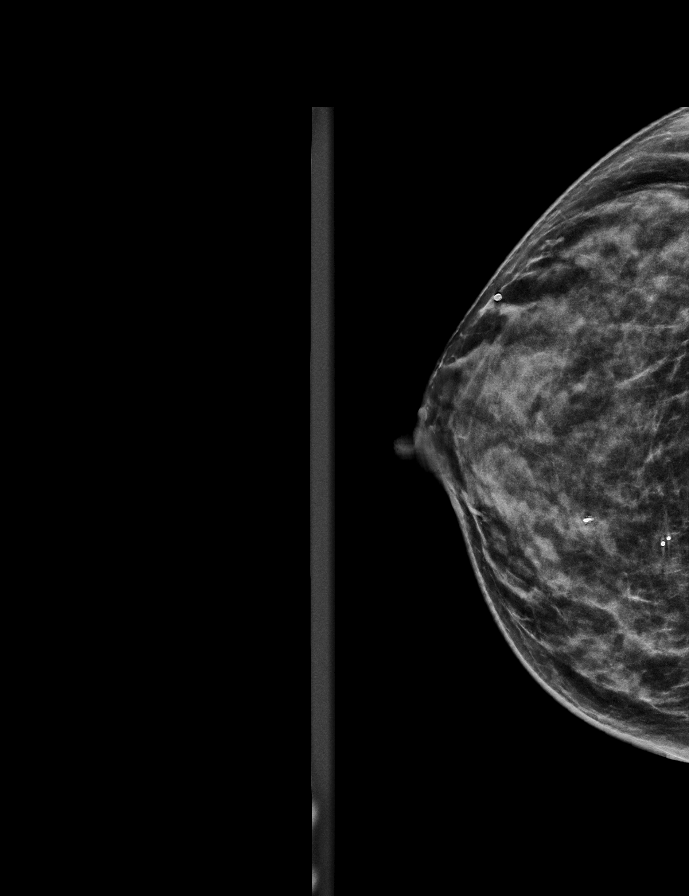

[R MLO tomo · 2 of 49 frames shown]
[frame 16/49]
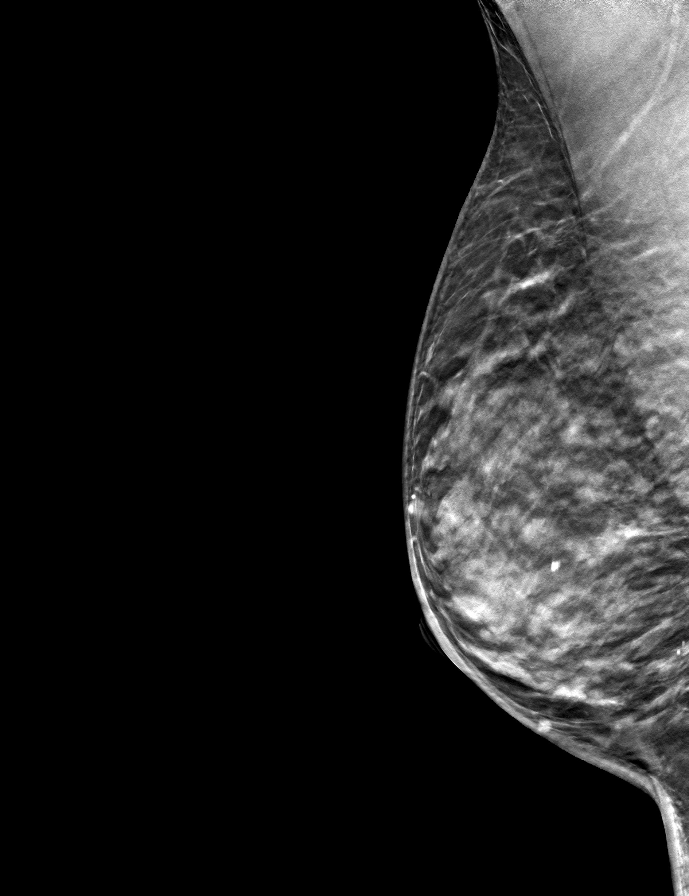
[frame 25/49]
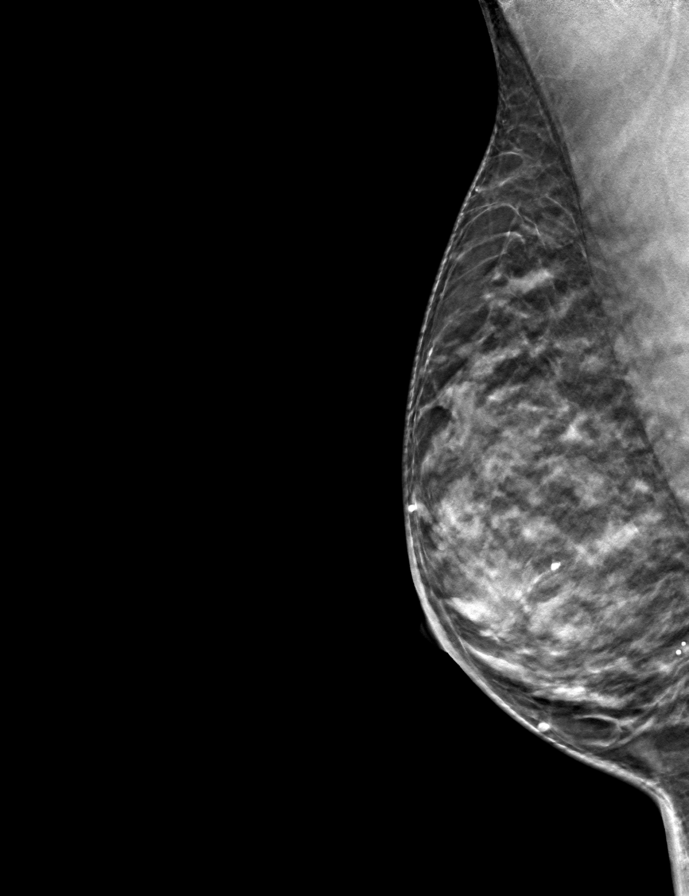

[L CC tomo · tomo slice 29/58.0]
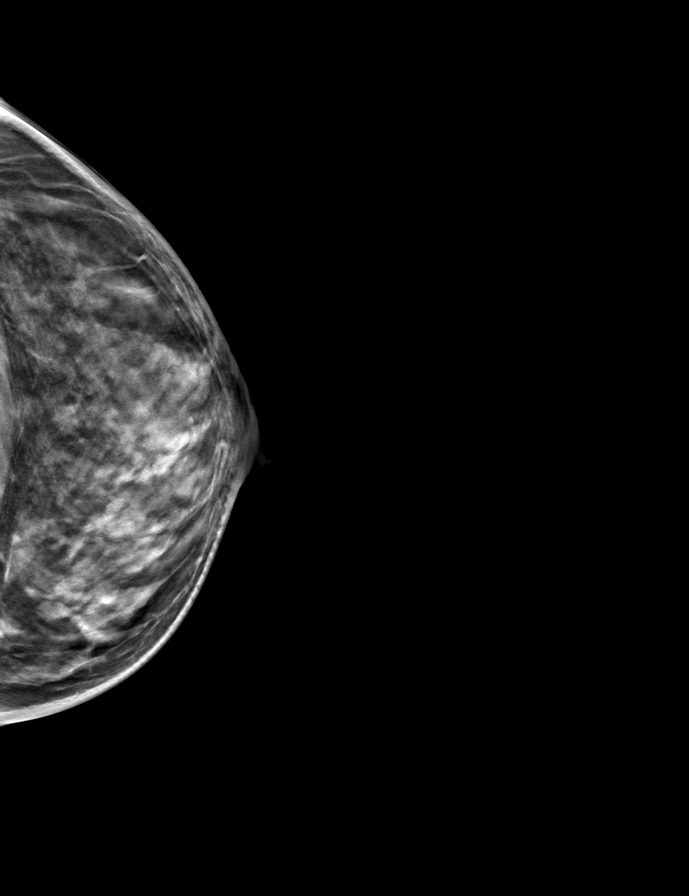

[L MLO tomo · tomo slice 27/52.0]
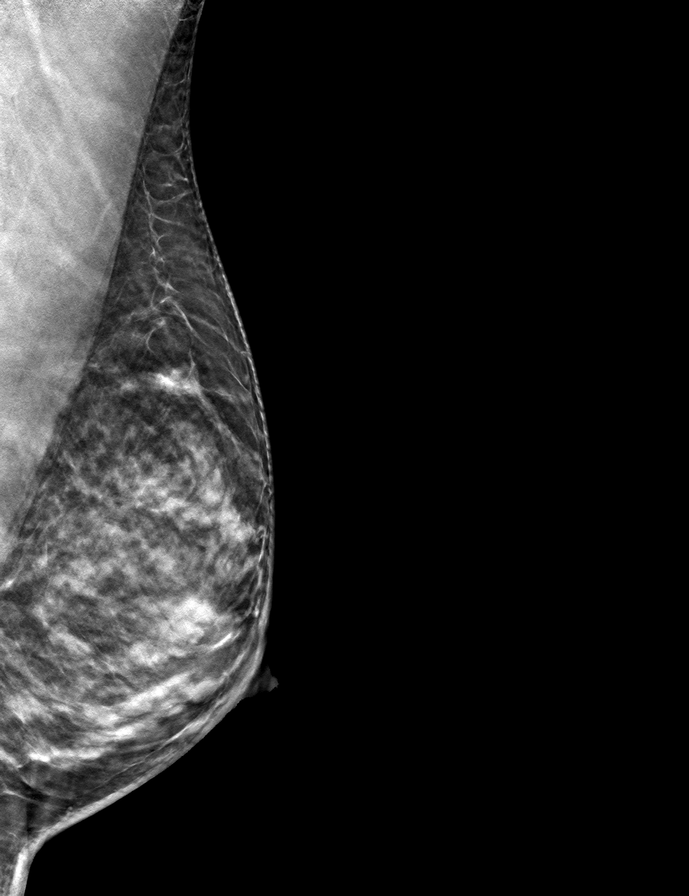

[R CC tomo · tomo slice 26/51.0]
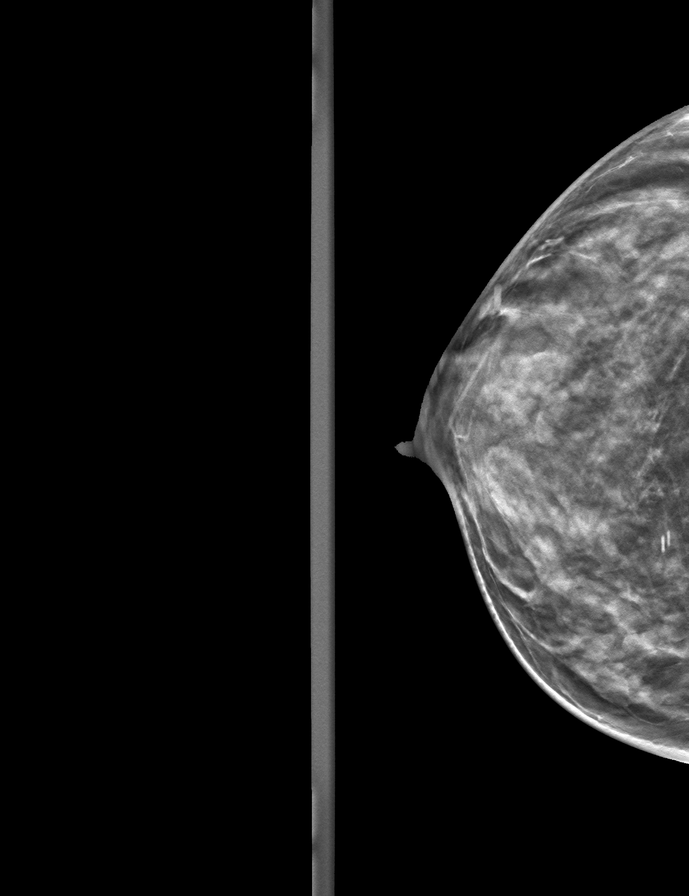

[9 of 24 positions shown; findings below may reference images not displayed]

ACR Breast Density Category c: The breast tissue is heterogeneously
dense, which may obscure small masses.
FINDINGS: There are no findings suspicious for malignancy.
IMPRESSION: No mammographic evidence of malignancy. A result letter of this
screening mammogram will be mailed directly to the patient.

RECOMMENDATION:
Screening mammogram in one year. (Code:Q3-W-BC3)

BI-RADS CATEGORY  1: Negative.

## 2023-10-09 ENCOUNTER — Other Ambulatory Visit: Payer: Self-pay | Admitting: Family Medicine

## 2023-10-09 DIAGNOSIS — Z1231 Encounter for screening mammogram for malignant neoplasm of breast: Secondary | ICD-10-CM

## 2023-10-25 ENCOUNTER — Ambulatory Visit
Admission: RE | Admit: 2023-10-25 | Discharge: 2023-10-25 | Disposition: A | Discharge status: Home / Self Care | Source: Ambulatory Visit | Attending: Family Medicine | Admitting: Family Medicine

## 2023-10-25 DIAGNOSIS — Z1231 Encounter for screening mammogram for malignant neoplasm of breast: Secondary | ICD-10-CM
